# Patient Record
Sex: Male | Born: 1970
Health system: Southern US, Community
[De-identification: ages and names within clinical notes are randomized; demographics above are authoritative.]

## PROBLEM LIST (undated history)

## (undated) DIAGNOSIS — J309 Allergic rhinitis, unspecified: Secondary | ICD-10-CM

## (undated) DIAGNOSIS — R1013 Epigastric pain: Secondary | ICD-10-CM

## (undated) DIAGNOSIS — S9002XA Contusion of left ankle, initial encounter: Secondary | ICD-10-CM

## (undated) DIAGNOSIS — N401 Enlarged prostate with lower urinary tract symptoms: Secondary | ICD-10-CM

## (undated) DIAGNOSIS — G8929 Other chronic pain: Secondary | ICD-10-CM

## (undated) DIAGNOSIS — N138 Other obstructive and reflux uropathy: Secondary | ICD-10-CM

## (undated) DIAGNOSIS — M546 Pain in thoracic spine: Secondary | ICD-10-CM

## (undated) DIAGNOSIS — J4520 Mild intermittent asthma, uncomplicated: Secondary | ICD-10-CM

## (undated) DIAGNOSIS — R208 Other disturbances of skin sensation: Secondary | ICD-10-CM

## (undated) DIAGNOSIS — F419 Anxiety disorder, unspecified: Secondary | ICD-10-CM

## (undated) HISTORY — DX: Benign prostatic hyperplasia with lower urinary tract symptoms: N40.1

## (undated) HISTORY — DX: Other chronic pain: G89.29

## (undated) HISTORY — DX: Allergic rhinitis, unspecified: J30.9

## (undated) HISTORY — DX: Anxiety disorder, unspecified: F41.9

## (undated) HISTORY — DX: Epigastric pain: R10.13

## (undated) HISTORY — DX: Other disturbances of skin sensation: R20.8

## (undated) HISTORY — DX: Other obstructive and reflux uropathy: N13.8

## (undated) HISTORY — DX: Contusion of left ankle, initial encounter: S90.02XA

## (undated) HISTORY — DX: Mild intermittent asthma, uncomplicated: J45.20

## (undated) HISTORY — DX: Pain in thoracic spine: M54.6

---

## 2000-10-09 ENCOUNTER — Encounter: Admission: RE | Admit: 2000-10-09 | Discharge: 2000-10-09 | Payer: Self-pay | Admitting: Family Medicine

## 2000-10-09 ENCOUNTER — Encounter: Payer: Self-pay | Admitting: Family Medicine

## 2000-11-04 ENCOUNTER — Encounter: Admission: RE | Admit: 2000-11-04 | Discharge: 2000-11-04 | Payer: Self-pay | Admitting: Family Medicine

## 2000-11-04 ENCOUNTER — Encounter: Payer: Self-pay | Admitting: Family Medicine

## 2004-07-30 ENCOUNTER — Ambulatory Visit: Payer: Self-pay | Admitting: Internal Medicine

## 2004-08-16 ENCOUNTER — Ambulatory Visit: Payer: Self-pay | Admitting: Internal Medicine

## 2004-10-02 ENCOUNTER — Ambulatory Visit: Payer: Self-pay | Admitting: Internal Medicine

## 2004-10-05 ENCOUNTER — Ambulatory Visit: Payer: Self-pay | Admitting: Internal Medicine

## 2004-12-14 ENCOUNTER — Ambulatory Visit: Payer: Self-pay | Admitting: Internal Medicine

## 2006-07-28 ENCOUNTER — Ambulatory Visit: Payer: Self-pay | Admitting: Internal Medicine

## 2007-07-15 ENCOUNTER — Ambulatory Visit: Payer: Self-pay | Admitting: Internal Medicine

## 2007-08-04 ENCOUNTER — Ambulatory Visit: Payer: Self-pay | Admitting: Internal Medicine

## 2008-02-11 ENCOUNTER — Ambulatory Visit: Payer: Self-pay | Admitting: Internal Medicine

## 2008-02-24 ENCOUNTER — Encounter: Payer: Self-pay | Admitting: Internal Medicine

## 2008-04-15 ENCOUNTER — Ambulatory Visit: Payer: Self-pay | Admitting: Family Medicine

## 2008-04-15 LAB — CONVERTED CEMR LAB: Inflenza A Ag: NEGATIVE

## 2008-06-23 ENCOUNTER — Ambulatory Visit: Payer: Self-pay | Admitting: Internal Medicine

## 2008-07-12 HISTORY — PX: VASECTOMY: SHX75

## 2008-08-11 ENCOUNTER — Ambulatory Visit: Payer: Self-pay | Admitting: Internal Medicine

## 2008-09-23 ENCOUNTER — Encounter: Payer: Self-pay | Admitting: Internal Medicine

## 2008-11-30 ENCOUNTER — Telehealth: Payer: Self-pay | Admitting: Internal Medicine

## 2009-01-12 ENCOUNTER — Ambulatory Visit: Payer: Self-pay | Admitting: Internal Medicine

## 2009-01-12 DIAGNOSIS — R51 Headache: Secondary | ICD-10-CM | POA: Insufficient documentation

## 2009-01-12 DIAGNOSIS — R519 Headache, unspecified: Secondary | ICD-10-CM | POA: Insufficient documentation

## 2009-01-12 DIAGNOSIS — K219 Gastro-esophageal reflux disease without esophagitis: Secondary | ICD-10-CM | POA: Insufficient documentation

## 2009-01-12 DIAGNOSIS — F411 Generalized anxiety disorder: Secondary | ICD-10-CM | POA: Insufficient documentation

## 2009-01-23 ENCOUNTER — Encounter: Payer: Self-pay | Admitting: Internal Medicine

## 2009-10-30 ENCOUNTER — Telehealth (INDEPENDENT_AMBULATORY_CARE_PROVIDER_SITE_OTHER): Payer: Self-pay | Admitting: *Deleted

## 2010-04-11 ENCOUNTER — Encounter: Payer: Self-pay | Admitting: Internal Medicine

## 2010-09-09 LAB — CONVERTED CEMR LAB
ALT: 16 units/L (ref 0–53)
AST: 18 units/L (ref 0–37)
Albumin: 4.3 g/dL (ref 3.5–5.2)
Alkaline Phosphatase: 69 units/L (ref 39–117)
BUN: 15 mg/dL (ref 6–23)
Basophils Absolute: 0 10*3/uL (ref 0.0–0.1)
Basophils Relative: 0 % (ref 0.0–1.0)
Bilirubin, Direct: 0.1 mg/dL (ref 0.0–0.3)
CO2: 32 meq/L (ref 19–32)
Calcium: 9.4 mg/dL (ref 8.4–10.5)
Chloride: 100 meq/L (ref 96–112)
Cholesterol: 124 mg/dL (ref 0–200)
Creatinine, Ser: 0.8 mg/dL (ref 0.4–1.5)
Eosinophils Absolute: 0.1 10*3/uL (ref 0.0–0.6)
Eosinophils Relative: 1.7 % (ref 0.0–5.0)
GFR calc Af Amer: 142 mL/min
GFR calc non Af Amer: 118 mL/min
Glucose, Bld: 90 mg/dL (ref 70–99)
HCT: 40.9 % (ref 39.0–52.0)
HDL: 38.4 mg/dL — ABNORMAL LOW (ref >39.0)
Hemoglobin: 13.9 g/dL (ref 13.0–17.0)
LDL Cholesterol: 78 mg/dL (ref 0–99)
Lymphocytes Relative: 24 % (ref 12.0–46.0)
MCHC: 34 g/dL (ref 30.0–36.0)
MCV: 86.8 fL (ref 78.0–100.0)
Monocytes Absolute: 0.5 10*3/uL (ref 0.2–0.7)
Monocytes Relative: 11.2 % — ABNORMAL HIGH (ref 3.0–11.0)
Neutro Abs: 2.7 10*3/uL (ref 1.4–7.7)
Neutrophils Relative %: 63.1 % (ref 43.0–77.0)
Platelets: 196 10*3/uL (ref 150–400)
Potassium: 4.1 meq/L (ref 3.5–5.1)
RBC: 4.71 M/uL (ref 4.22–5.81)
RDW: 12.5 % (ref 11.5–14.6)
Sodium: 138 meq/L (ref 135–145)
TSH: 0.96 microintl units/mL (ref 0.35–5.50)
Total Bilirubin: 0.5 mg/dL (ref 0.3–1.2)
Total CHOL/HDL Ratio: 3.2
Total Protein: 6.6 g/dL (ref 6.0–8.3)
Triglycerides: 37 mg/dL (ref 0–149)
VLDL: 7 mg/dL (ref 0–40)
WBC: 4.3 10*3/uL — ABNORMAL LOW (ref 4.5–10.5)

## 2010-09-11 NOTE — Progress Notes (Signed)
Summary: Refil Request  Phone Note Refill Request Message from:  [facility] [address] Fax #: [phone]  Refills Requested: Medication #1:  CLONAZEPAM 0.5 MG  TABS Take 1/2 to 1 tablet every 8 to 12 hours as needed   Dosage confirmed as above?Dosage Confirmed   Last Refilled: 02/08/2009 Next Appointment Scheduled: none Initial call taken by: Harold Barban,  October 30, 2009 9:55 AM    Prescriptions: CLONAZEPAM 0.5 MG  TABS (CLONAZEPAM) Take 1/2 to 1 tablet every 8 to 12 hours as needed  #60 x 1   Entered by:   Shonna Chock   Authorized by:   Marga Melnick MD   Signed by:   Shonna Chock on 10/30/2009   Method used:   Printed then faxed to ...         RxID:   [id number]

## 2010-09-11 NOTE — Letter (Signed)
[facility]   Imported By: Lanelle Bal 04/24/2010 09:18:30  _____________________________________________________________________  External Attachment:    Type:   Image     Comment:   External Document

## 2010-09-14 ENCOUNTER — Encounter: Payer: Self-pay | Admitting: Internal Medicine

## 2010-09-14 ENCOUNTER — Ambulatory Visit (INDEPENDENT_AMBULATORY_CARE_PROVIDER_SITE_OTHER): Payer: BC Managed Care – PPO | Admitting: Internal Medicine

## 2010-09-14 DIAGNOSIS — J069 Acute upper respiratory infection, unspecified: Secondary | ICD-10-CM | POA: Insufficient documentation

## 2010-09-19 ENCOUNTER — Ambulatory Visit: Payer: Self-pay | Admitting: Internal Medicine

## 2010-09-27 NOTE — Assessment & Plan Note (Signed)
Summary: fever/kn   Vital Signs:  Patient profile:   40 year old male Height:      68.25 inches Weight:      151.50 pounds BMI:     22.95 Temp:     98.4 degrees F oral Pulse rate:   80 / minute Pulse rhythm:   regular BP sitting:   126 / 82  (left arm) Cuff size:   regular  Vitals Entered By: Army Fossa CMA (September 14, 2010 11:29 AM) CC: Pt here states his temp has been 96-97 Comments [facility]    History of Present Illness: he thinks his temperature has been low. He checked his temperature with a thermometer in the forehead and it read 96. He feels fine except for a cold that started few days ago. See review of systems  Current Medications (verified): 1)  Clonazepam 0.5 Mg  Tabs (Clonazepam) .... Take 1/2 To 1 Tablet Every 8 To 12 Hours As Needed 2)  Sudafed 24 Hour Non-Drowsy 240 Mg Xr24h-Tab (Pseudoephedrine Hcl) .... As Needed. 3)  [facility] Ibuprofen 200 Mg Tabs (Ibuprofen) .... As Needed. 4)  Vicks Nyquil Multi-Symptom 15-6.25-325 Mg Caps (Dm-Doxylamine-Acetaminophen) .... As Needed For Sleep 5)  Claritin 10 Mg Tabs (Loratadine) .... Qd 6)  Effexor Xr 37.5 Mg Xr24h-Cap (Venlafaxine Hcl) .... Qd  Allergies (verified): 1)  ! * Z Pack  Past History:  Past Medical History: Reviewed history from 01/12/2009 and no changes required. Anxiety ,treated with  Clonazepam as needed  GERD  Past Surgical History: Reviewed history from 01/12/2009 and no changes required. Vasectomy  Social History: Reviewed history from 01/12/2009 and no changes required. No diet Married Never Smoked Alcohol use-yes: occa Regular exercise-yes Occupation: [employer]  Review of Systems ENT:  Denies ear discharge and sore throat; (+) congestion @ L sinus for the last few days occasional throbbing pain around the left ear . Resp:  Denies cough. GI:  Denies diarrhea, nausea, and vomiting. GU:  Denies dysuria, hematuria, urinary frequency, and urinary hesitancy.  Physical  Exam  General:  alert and well-developed.  no apparent distress Head:  face symmetric, nontender to palpation Ears:  R ear normal and L ear normal.  not tender to percussion at the mastoid area Nose:  slightly congested Mouth:  no redness or discharge Lungs:  Normal respiratory effort, chest expands symmetrically. Lungs are clear to auscultation, no crackles or wheezes.    Impression & Recommendations:  Problem # 1:  low temperature? temperature today is a little high, observation   Problem # 2:  URI (ICD-465.9) developed URI symptoms for few days including a throbbing pain in the mastoid area on the left. Exam is benign. Use Flonase for a month, see instructions    Complete Medication List: 1)  Clonazepam 0.5 Mg Tabs (Clonazepam) .... Take 1/2 to 1 tablet every 8 to 12 hours as needed 2)  Sudafed 24 Hour Non-drowsy 240 Mg Xr24h-tab (Pseudoephedrine hcl) .... As needed. 3)  [facility] Ibuprofen 200 Mg Tabs (Ibuprofen) .... As needed. 4)  Vicks Nyquil Multi-symptom 15-6.25-325 Mg Caps (Dm-doxylamine-acetaminophen) .... As needed for sleep 5)  Claritin 10 Mg Tabs (Loratadine) .... Qd 6)  Effexor Xr 37.5 Mg Xr24h-cap (Venlafaxine hcl) .... Qd 7)  Flonase 50 Mcg/act Susp (Fluticasone propionate) .... 2 sprays on each side of the nose x 1 month  Patient Instructions: 1)  rest, fluids 2)  flonase x 1 month 3)  call if symptoms get worse , high fever Prescriptions: FLONASE 50  MCG/ACT SUSP (FLUTICASONE PROPIONATE) 2 sprays on each side of the nose x 1 month  #1 x 1   Entered and Authorized by:   Elita Quick E. Jayden Kratochvil MD   Signed by:   Nolon Rod. Amahri Dengel MD on 09/14/2010   Method used:   Print then Give to Patient   RxID:   [id number]    Orders Added: 1)  Est. Patient Level III [13086]

## 2010-12-26 ENCOUNTER — Other Ambulatory Visit: Payer: Self-pay | Admitting: Internal Medicine

## 2011-01-30 ENCOUNTER — Other Ambulatory Visit: Payer: Self-pay | Admitting: Internal Medicine

## 2011-02-05 ENCOUNTER — Other Ambulatory Visit: Payer: Self-pay | Admitting: Internal Medicine

## 2011-02-06 MED ORDER — CLONAZEPAM 0.5 MG PO TABS: 0.5000 mg | ORAL_TABLET | Freq: Two times a day (BID) | ORAL | Status: DC

## 2011-02-06 NOTE — Telephone Encounter (Signed)
Medication wasn't sent to pharmacy pt last seen by hop 2010 will need office visit

## 2011-02-11 ENCOUNTER — Telehealth: Payer: Self-pay

## 2011-02-11 NOTE — Telephone Encounter (Signed)
Paper fax received from [facility] phone [phone], fax [phone], Clonazepam 0.5mg  Tab  (1/2-1 by mouth every 8-12 hours as needed).   **Last OV with Dr.Hopper 01/2009. Seen for acute by Dr.Paz 09/2010 (NO PENDING APPOINTMENT)** I called the pharmacy and informed them Dr.Hopper out of office until Thursday afternoon  Dr.Hopper please advise

## 2011-02-12 MED ORDER — CLONAZEPAM 0.5 MG PO TABS: 0.5000 mg | ORAL_TABLET | Freq: Two times a day (BID) | ORAL | Status: DC

## 2011-02-12 NOTE — Telephone Encounter (Signed)
OK #30 

## 2011-02-12 NOTE — Telephone Encounter (Signed)
RX called in .

## 2011-03-09 ENCOUNTER — Other Ambulatory Visit: Payer: Self-pay | Admitting: Internal Medicine

## 2011-06-06 ENCOUNTER — Other Ambulatory Visit: Payer: Self-pay | Admitting: Internal Medicine

## 2011-06-06 NOTE — Telephone Encounter (Signed)
Last 09/14/10

## 2011-06-12 NOTE — Telephone Encounter (Signed)
Denied, last office visit with PCP, Dr. Alwyn Ren in 2010.

## 2011-06-13 ENCOUNTER — Encounter: Payer: Self-pay | Admitting: Internal Medicine

## 2011-06-14 ENCOUNTER — Ambulatory Visit (INDEPENDENT_AMBULATORY_CARE_PROVIDER_SITE_OTHER): Payer: BC Managed Care – PPO | Admitting: Internal Medicine

## 2011-06-14 ENCOUNTER — Encounter: Payer: Self-pay | Admitting: Internal Medicine

## 2011-06-14 ENCOUNTER — Ambulatory Visit (HOSPITAL_BASED_OUTPATIENT_CLINIC_OR_DEPARTMENT_OTHER)
Admission: RE | Admit: 2011-06-14 | Discharge: 2011-06-14 | Disposition: A | Discharge status: Home / Self Care | Payer: BC Managed Care – PPO | Source: Ambulatory Visit | Attending: Internal Medicine | Admitting: Internal Medicine

## 2011-06-14 DIAGNOSIS — R059 Cough, unspecified: Secondary | ICD-10-CM | POA: Insufficient documentation

## 2011-06-14 DIAGNOSIS — R05 Cough: Secondary | ICD-10-CM

## 2011-06-14 DIAGNOSIS — J209 Acute bronchitis, unspecified: Secondary | ICD-10-CM

## 2011-06-14 DIAGNOSIS — J309 Allergic rhinitis, unspecified: Secondary | ICD-10-CM

## 2011-06-14 LAB — CBC WITH DIFFERENTIAL/PLATELET
Basophils Absolute: 0 10*3/uL (ref 0.0–0.1)
Basophils Relative: 0.5 % (ref 0.0–3.0)
Eosinophils Absolute: 0.1 10*3/uL (ref 0.0–0.7)
HCT: 42.2 % (ref 39.0–52.0)
Hemoglobin: 13.9 g/dL (ref 13.0–17.0)
Lymphocytes Relative: 12.2 % (ref 12.0–46.0)
Lymphs Abs: 1.3 10*3/uL (ref 0.7–4.0)
MCHC: 33 g/dL (ref 30.0–36.0)
MCV: 87.5 fl (ref 78.0–100.0)
Monocytes Absolute: 0.7 10*3/uL (ref 0.1–1.0)
Neutro Abs: 8.4 10*3/uL — ABNORMAL HIGH (ref 1.4–7.7)
RBC: 4.83 Mil/uL (ref 4.22–5.81)
RDW: 12.3 % (ref 11.5–14.6)

## 2011-06-14 MED ORDER — FLUTICASONE-SALMETEROL 250-50 MCG/DOSE IN AEPB: 1.0000 | INHALATION_SPRAY | Freq: Two times a day (BID) | RESPIRATORY_TRACT | Status: DC

## 2011-06-14 MED ORDER — FLUTICASONE PROPIONATE 50 MCG/ACT NA SUSP: 1.0000 | NASAL | Status: DC

## 2011-06-14 MED ORDER — HYDROCODONE-HOMATROPINE 5-1.5 MG/5ML PO SYRP: 5.0000 mL | ORAL_SOLUTION | Freq: Four times a day (QID) | ORAL | Status: AC | PRN

## 2011-06-14 MED ORDER — PREDNISONE 20 MG PO TABS: 20.0000 mg | ORAL_TABLET | Freq: Two times a day (BID) | ORAL | Status: AC

## 2011-06-14 NOTE — Patient Instructions (Addendum)
Plain Mucinex for thick secretions ;force NON dairy fluids for next 48 hrs. Use a Neti pot daily as needed for sinus congestion  Order for x-rays entered into  the computer; these will be performed at [facility], [address]. No appointment is necessary.

## 2011-06-14 NOTE — Progress Notes (Signed)
  Subjective:    Patient ID: Travis Reeves, male    DOB: 1971-06-01, 40 y.o.   MRN: [id number]  HPI Cough: Onset/symptoms: Rapid Strep documented ST 3 weeks ago; treated with PCN X 10 days [facility];nasal congestion after 3 days of Pen VK Progression of symptoms:to chest after head symptoms; [facility] 10[facility] ; Cefdinir Rxed with marginal benefit Present symptoms: Fever/chills/sweats:no Frontal headache:no Facial pain:no Nasal purulence:no color Sore throat:not now Dental pain:no Lymphadenopathy:no Wheezing/shortness of breath:no but [facility]. Rxed albuterol Cough/sputum/hemoptysis:scant clear sputum Pleuritic pain:no Associated extrinsic/allergic symptoms:itchy eyes/ sneezing:no Past medical history: Seasonal allergies : yes/asthma:no; MGM  & mother had asthma Smoking history:no            Review of Systems     Objective:   Physical Exam General appearance:thin but  good health and nourishment; no acute distress or increased work of breathing is present.  No  lymphadenopathy about the head, neck, or axilla noted.   Eyes: No conjunctival inflammation or lid edema is present. There is no scleral icterus.  Ears:  External ear exam shows no significant lesions or deformities.  Otoscopic examination reveals clear canals, tympanic membranes are intact bilaterally without bulging, retraction, inflammation or discharge.  Nose:  External nasal examination shows no deformity or inflammation. Nasal mucosa are pink and moist without lesions or exudates. No septal dislocation.No obstruction to airflow.   Oral exam: Dental hygiene is good; lips and gums are healthy appearing.There is no oropharyngeal erythema or exudate noted.     Heart:  Normal rate and regular rhythm. S1 and S2 normal without gallop, murmur, click, rub or other extra sounds.   Lungs:Chest clear to auscultation; no wheezes, rhonchi,rales ,or rubs present.No increased work of breathing. Although  there is no clinical bronchospasm on auscultation; with forced expiration there is harsh wheezing of upper airway  Extremities:  No cyanosis, edema, or clubbing  noted    Skin: Warm & dry w/o jaundice or tenting.         Assessment & Plan:  #1 protracted cough; clinically reactive airways disease is suggested. At this time he has no purulent secretions after a course of penicillin and partial course of Cefdinir.  Plan: See orders recommendations.

## 2011-06-19 ENCOUNTER — Telehealth: Payer: Self-pay

## 2011-06-19 NOTE — Telephone Encounter (Signed)
Pt called for CXR results. Advised pt that Hopp noted no pneumonia present. Pt verbalized understanding

## 2011-07-11 ENCOUNTER — Ambulatory Visit: Payer: BC Managed Care – PPO | Admitting: Internal Medicine

## 2012-06-27 ENCOUNTER — Other Ambulatory Visit: Payer: Self-pay | Admitting: Internal Medicine

## 2012-08-18 ENCOUNTER — Other Ambulatory Visit: Payer: Self-pay | Admitting: Internal Medicine

## 2012-08-18 NOTE — Telephone Encounter (Signed)
Patient needs to schedule a CPX  

## 2012-10-08 ENCOUNTER — Other Ambulatory Visit: Payer: Self-pay | Admitting: Internal Medicine

## 2012-10-08 NOTE — Telephone Encounter (Signed)
Patient needs to schedule CPX, last OV 2012

## 2012-11-27 ENCOUNTER — Other Ambulatory Visit: Payer: Self-pay | Admitting: Internal Medicine

## 2012-11-27 NOTE — Telephone Encounter (Signed)
Pt has not been seen in over a year. OK to refill? 

## 2012-11-27 NOTE — Telephone Encounter (Signed)
Refill done.  

## 2012-11-27 NOTE — Telephone Encounter (Signed)
OK ; R X 11

## 2013-03-05 ENCOUNTER — Encounter: Payer: Self-pay | Admitting: Internal Medicine

## 2013-03-05 ENCOUNTER — Ambulatory Visit: Payer: BC Managed Care – PPO

## 2013-03-05 ENCOUNTER — Ambulatory Visit (INDEPENDENT_AMBULATORY_CARE_PROVIDER_SITE_OTHER): Payer: BC Managed Care – PPO | Admitting: Internal Medicine

## 2013-03-05 VITALS — BP 106/62 | HR 64 | Temp 98.0°F | Resp 12 | Ht 69.5 in | Wt 146.0 lb

## 2013-03-05 DIAGNOSIS — Z0000 Encounter for general adult medical examination without abnormal findings: Secondary | ICD-10-CM

## 2013-03-05 DIAGNOSIS — J310 Chronic rhinitis: Secondary | ICD-10-CM

## 2013-03-05 DIAGNOSIS — H109 Unspecified conjunctivitis: Secondary | ICD-10-CM | POA: Insufficient documentation

## 2013-03-05 DIAGNOSIS — Z23 Encounter for immunization: Secondary | ICD-10-CM

## 2013-03-05 LAB — CBC WITH DIFFERENTIAL/PLATELET
Basophils Absolute: 0 10*3/uL (ref 0.0–0.1)
Basophils Relative: 1 % (ref 0–1)
Hemoglobin: 13.8 g/dL (ref 13.0–17.0)
Lymphocytes Relative: 28 % (ref 12–46)
MCHC: 33.3 g/dL (ref 30.0–36.0)
Neutro Abs: 2.5 10*3/uL (ref 1.7–7.7)
Neutrophils Relative %: 58 % (ref 43–77)
RDW: 13.9 % (ref 11.5–15.5)
WBC: 4.3 10*3/uL (ref 4.0–10.5)

## 2013-03-05 LAB — BASIC METABOLIC PANEL
BUN: 16 mg/dL (ref 6–23)
Chloride: 104 mEq/L (ref 96–112)
Glucose, Bld: 83 mg/dL (ref 70–99)
Potassium: 4.3 mEq/L (ref 3.5–5.1)

## 2013-03-05 LAB — LIPID PANEL: Cholesterol: 150 mg/dL (ref 0–200)

## 2013-03-05 LAB — HEPATIC FUNCTION PANEL
ALT: 16 U/L (ref 0–53)
AST: 18 U/L (ref 0–37)
Albumin: 4.2 g/dL (ref 3.5–5.2)

## 2013-03-05 NOTE — Progress Notes (Signed)
  Subjective:    Patient ID: Travis Reeves, male    DOB: June 28, 1971, 42 y.o.   MRN: [id number]  HPI  He is here for a physical;acute issues denied.     Review of Systems   He has had perennial rhinoconjunctivitis since childhood. Previously had sneezing but this is controlled with Claritin and Flonase. His major symptoms consist of itchy, watery eyes. Symptoms do tend to exacerbate seasonally.     Objective:   Physical Exam  Gen.: Healthy and well-nourished in appearance. Alert, appropriate and cooperative throughout exam. Appears younger than stated age  Head: Normocephalic without obvious abnormalities; no alopecia  Eyes: No corneal or conjunctival inflammation noted. Pupils equal round reactive to light and accommodation.  Extraocular motion intact. Vision grossly normal with lenses Ears: External  ear exam reveals no significant lesions or deformities. Canals clear .TMs normal. Hearing is grossly normal bilaterally. Nose: External nasal exam reveals no deformity or inflammation. Nasal mucosa are pink and moist. No lesions or exudates noted.  Mouth: Oral mucosa and oropharynx reveal no lesions or exudates. Teeth in good repair. Neck: No deformities, masses, or tenderness noted. Range of motion & Thyroid normal. Lungs: Normal respiratory effort; chest expands symmetrically. Lungs are clear to auscultation without rales, wheezes, or increased work of breathing. Heart: Normal rate and rhythm. Normal S1 and S2. No gallop or rub. Intermittent apical click w/o MR murmur. Abdomen: Bowel sounds normal; abdomen soft and nontender. No masses, organomegaly or hernias noted. Genitalia: Genitalia normal except for bilateral post vasectomy granuloma. Prostate is normal without enlargement, asymmetry, nodularity, or induration.                                  Musculoskeletal/extremities: No deformity or scoliosis noted of  the thoracic or lumbar spine.  No clubbing, cyanosis, edema, or  significant extremity  deformity noted. Range of motion normal .Tone & strength  Normal. Joints normal . Nail health good. Able to lie down & sit up w/o help. Negative SLR bilaterally Vascular: Carotid, radial artery, dorsalis pedis and  posterior tibial pulses are full and equal. No bruits present. Neurologic: Alert and oriented x3. Deep tendon reflexes symmetrical and normal.        Skin: Intact without suspicious lesions or rashes.4 X 8 mm nevus anterior neck Lymph: No cervical, axillary, or inguinal lymphadenopathy present. Psych: Mood and affect are normal. Normally interactive                                                                                      Assessment & Plan:  #1 comprehensive physical exam; no acute findings  Plan: see Orders  & Recommendations

## 2013-03-05 NOTE — Patient Instructions (Addendum)
Plain Mucinex (NOT D) for thick secretions ;force NON dairy fluids .   Nasal cleansing in the shower as discussed with lather of mild shampoo.After 10 seconds wash off lather while  exhaling through nostrils. Make sure that all residual soap is removed to prevent irritation.  Fluticasone 1 spray in each nostril twice a day as needed. Use the "crossover" technique into opposite nostril spraying toward opposite ear @ 45 degree angle, not straight up into nostril.  Use a Neti pot daily only  as needed for significant sinus congestion; going from open side to congested side . Plain Allegra (NOT D )  160 daily , Loratidine 10 mg , OR Zyrtec 10 mg @ bedtime  as needed for itchy eyes & sneezing.  Use your cell phone camera to monitor  the skin lesions (nevi). Take a photo of the skin lesions every 3 months with a small ruler immediately below the lesion to define any change in size, shape or color.      If you activate the  My Chart system; lab & Xray results will be released directly  to you as soon as I review & address these through the computer. If you choose not to sign up for My Chart within 36 hours of labs being drawn; results will be reviewed & interpretation added before being copied & mailed, causing a delay in getting the results to you.If you do not receive that report within 7-10 days ,please call. Additionally you can use this system to gain direct  access to your records  if  out of town or @ an office of a  physician who is not in  the My Chart network.  This improves continuity of care & places you in control of your medical record.

## 2013-11-30 ENCOUNTER — Other Ambulatory Visit: Payer: Self-pay

## 2013-11-30 MED ORDER — FLUTICASONE PROPIONATE 50 MCG/ACT NA SUSP
NASAL | Status: DC
Start: 2013-11-30 — End: 2014-12-29

## 2014-06-23 ENCOUNTER — Other Ambulatory Visit (INDEPENDENT_AMBULATORY_CARE_PROVIDER_SITE_OTHER): Payer: BC Managed Care – PPO

## 2014-06-23 ENCOUNTER — Encounter: Payer: Self-pay | Admitting: Internal Medicine

## 2014-06-23 ENCOUNTER — Ambulatory Visit (INDEPENDENT_AMBULATORY_CARE_PROVIDER_SITE_OTHER): Payer: BC Managed Care – PPO | Admitting: Internal Medicine

## 2014-06-23 VITALS — BP 120/78 | HR 60 | Temp 97.7°F | Resp 12 | Ht 69.0 in | Wt 149.0 lb

## 2014-06-23 DIAGNOSIS — Z0189 Encounter for other specified special examinations: Secondary | ICD-10-CM

## 2014-06-23 DIAGNOSIS — Z0000 Encounter for general adult medical examination without abnormal findings: Secondary | ICD-10-CM

## 2014-06-23 LAB — CBC WITH DIFFERENTIAL/PLATELET
BASOS ABS: 0 10*3/uL (ref 0.0–0.1)
Basophils Relative: 0.4 % (ref 0.0–3.0)
EOS ABS: 0.2 10*3/uL (ref 0.0–0.7)
Eosinophils Relative: 2.5 % (ref 0.0–5.0)
HCT: 43.1 % (ref 39.0–52.0)
Hemoglobin: 14.3 g/dL (ref 13.0–17.0)
LYMPHS PCT: 16.7 % (ref 12.0–46.0)
Lymphs Abs: 1.2 10*3/uL (ref 0.7–4.0)
MCHC: 33.2 g/dL (ref 30.0–36.0)
MCV: 85.8 fl (ref 78.0–100.0)
MONO ABS: 0.6 10*3/uL (ref 0.1–1.0)
Monocytes Relative: 7.9 % (ref 3.0–12.0)
NEUTROS PCT: 72.5 % (ref 43.0–77.0)
Neutro Abs: 5 10*3/uL (ref 1.4–7.7)
PLATELETS: 201 10*3/uL (ref 150.0–400.0)
RBC: 5.02 Mil/uL (ref 4.22–5.81)
RDW: 12.8 % (ref 11.5–15.5)
WBC: 7 10*3/uL (ref 4.0–10.5)

## 2014-06-23 LAB — HEPATIC FUNCTION PANEL
ALBUMIN: 3.9 g/dL (ref 3.5–5.2)
ALK PHOS: 61 U/L (ref 39–117)
ALT: 17 U/L (ref 0–53)
AST: 20 U/L (ref 0–37)
BILIRUBIN DIRECT: 0.1 mg/dL (ref 0.0–0.3)
Total Bilirubin: 0.7 mg/dL (ref 0.2–1.2)
Total Protein: 6.7 g/dL (ref 6.0–8.3)

## 2014-06-23 LAB — LIPID PANEL
CHOL/HDL RATIO: 4
Cholesterol: 170 mg/dL (ref 0–200)
HDL: 45.7 mg/dL (ref >39.00)
LDL Cholesterol: 112 mg/dL — ABNORMAL HIGH (ref 0–99)
NONHDL: 124.3
TRIGLYCERIDES: 61 mg/dL (ref 0.0–149.0)
VLDL: 12.2 mg/dL (ref 0.0–40.0)

## 2014-06-23 LAB — BASIC METABOLIC PANEL
BUN: 16 mg/dL (ref 6–23)
CALCIUM: 9.2 mg/dL (ref 8.4–10.5)
CO2: 28 meq/L (ref 19–32)
CREATININE: 0.8 mg/dL (ref 0.4–1.5)
Chloride: 104 mEq/L (ref 96–112)
GFR: 121.5 mL/min (ref >60.00)
GLUCOSE: 92 mg/dL (ref 70–99)
Potassium: 4 mEq/L (ref 3.5–5.1)
Sodium: 139 mEq/L (ref 135–145)

## 2014-06-23 LAB — TSH: TSH: 0.99 u[IU]/mL (ref 0.35–4.50)

## 2014-06-23 NOTE — Progress Notes (Signed)
   Subjective:    Patient ID: Travis Reeves, male    DOB: 07-Apr-1971, 43 y.o.   MRN: [id number]  HPI He is here for a physical;acute issues denied other than resolving URI symptoms of head congestion.  He is on heart healthy diet for the most part. Typically he will run over 6 miles twice a week with no cardiopulmonary symptoms.  Both parents had diabetes as well as dyslipidemia.  She has seasonal allergies which are mainly conjunctivitis in nature. He does not feel that these are significant.     Review of Systems   Chest pain, palpitations, tachycardia, exertional dyspnea, paroxysmal nocturnal dyspnea, claudication or edema are absent.  Frontal headache, facial pain , nasal purulence, dental pain, sore throat , otic pain or otic discharge denied. No fever , chills or sweats.      Objective:   Physical Exam  Gen.: Healthy and well-nourished in appearance. Alert, appropriate and cooperative throughout exam. Appears younger than stated age  Head: Normocephalic without obvious abnormalities; no alopecia  Eyes: No corneal or conjunctival inflammation noted. Minor scleritis present.Pupils equal round reactive to light and accommodation. Extraocular motion intact.  Ears: External  ear exam reveals no significant lesions or deformities. Canals clear .TMs normal. Hearing is grossly normal bilaterally. Nose: External nasal exam reveals no deformity or inflammation. Nasal mucosa are pink and moist. No lesions or exudates noted.   Mouth: Oral mucosa and oropharynx reveal no lesions or exudates. Teeth in good repair. Neck: No deformities, masses, or tenderness noted. Range of motion & Thyroid normal. Lungs: Normal respiratory effort; chest expands symmetrically. Lungs are clear to auscultation without rales, wheezes, or increased work of breathing. Heart: Normal rate and rhythm. Normal S1 and S2. No gallop, click, or rub. No murmur. Abdomen: Bowel sounds normal; abdomen soft and  nontender. No masses, organomegaly or hernias noted. Genitalia: Genitalia normal except for vasectomy scar tissue. Weakness w/o hernia R intrascrotal area. Prostate is  upper limits normal in size without  asymmetry, nodularity, or induration       Musculoskeletal/extremities: No deformity or scoliosis noted of  the thoracic or lumbar spine.  No clubbing, cyanosis, edema, or significant extremity  deformity noted. Range of motion normal .Tone & strength normal. Hand joints normal  Fingernail health good. Able to lie down & sit up w/o help. Negative SLR bilaterally Vascular: Carotid, radial artery, dorsalis pedis and  posterior tibial pulses are full and equal. No bruits present. Neurologic: Alert and oriented x3. Deep tendon reflexes symmetrical and normal.  Gait normal.      Skin: Intact without suspicious lesions or rashes. Lymph: No cervical, axillary, or inguinal lymphadenopathy present. Psych: Mood and affect are normal. Normally interactive                                                                                       Assessment & Plan:  #1 comprehensive physical exam; no acute findings  Plan: see Orders  & Recommendations

## 2014-06-23 NOTE — Patient Instructions (Addendum)
Your next office appointment will be determined based upon review of your pending labs . Those instructions will be transmitted to you through My Chart  OR  by mail;whichever process is your choice to receive results & recommendations .  As per the Standard of Care , screening Colonoscopy recommended @ 50 & every 5-10 years thereafter . More frequent monitor would be dictated by family history ( ADENOMATOUS polyps or colon cancer) or findings @ Colonoscopy  Plain Mucinex (NOT D) for thick secretions ;force NON dairy fluids .   Nasal cleansing in the shower as discussed with lather of mild shampoo.After 10 seconds wash off lather while  exhaling through nostrils. Make sure that all residual soap is removed to prevent irritation.  Flonase OR Nasacort AQ 1 spray in each nostril twice a day as needed. Use the "crossover" technique into opposite nostril spraying toward opposite ear @ 45 degree angle, not straight up into nostril.  Use a Neti pot daily only  as needed for significant sinus congestion; going from open side to congested side . Plain Allegra (NOT D )  160 daily , Loratidine 10 mg , OR Zyrtec 10 mg @ bedtime  as needed for itchy eyes & sneezing.

## 2014-06-23 NOTE — Progress Notes (Signed)
Pre visit review using our clinic review tool, if applicable. No additional management support is needed unless otherwise documented below in the visit note. 

## 2014-06-24 ENCOUNTER — Encounter: Payer: Self-pay | Admitting: Internal Medicine

## 2014-06-24 DIAGNOSIS — E785 Hyperlipidemia, unspecified: Secondary | ICD-10-CM | POA: Insufficient documentation

## 2014-12-29 ENCOUNTER — Other Ambulatory Visit: Payer: Self-pay | Admitting: Internal Medicine

## 2015-11-30 ENCOUNTER — Other Ambulatory Visit (INDEPENDENT_AMBULATORY_CARE_PROVIDER_SITE_OTHER): Payer: BLUE CROSS/BLUE SHIELD

## 2015-11-30 ENCOUNTER — Encounter: Payer: Self-pay | Admitting: Family

## 2015-11-30 ENCOUNTER — Ambulatory Visit (INDEPENDENT_AMBULATORY_CARE_PROVIDER_SITE_OTHER): Payer: BLUE CROSS/BLUE SHIELD | Admitting: Family

## 2015-11-30 ENCOUNTER — Telehealth: Payer: Self-pay | Admitting: Family

## 2015-11-30 VITALS — BP 108/80 | HR 54 | Temp 97.9°F | Resp 16 | Ht 69.5 in | Wt 146.0 lb

## 2015-11-30 DIAGNOSIS — Z0000 Encounter for general adult medical examination without abnormal findings: Secondary | ICD-10-CM | POA: Diagnosis not present

## 2015-11-30 LAB — CBC
HCT: 42.3 % (ref 39.0–52.0)
Hemoglobin: 14.3 g/dL (ref 13.0–17.0)
MCHC: 33.9 g/dL (ref 30.0–36.0)
MCV: 85 fl (ref 78.0–100.0)
Platelets: 242 10*3/uL (ref 150.0–400.0)
RBC: 4.98 Mil/uL (ref 4.22–5.81)
RDW: 12.6 % (ref 11.5–15.5)
WBC: 5.2 10*3/uL (ref 4.0–10.5)

## 2015-11-30 LAB — COMPREHENSIVE METABOLIC PANEL
ALBUMIN: 4.5 g/dL (ref 3.5–5.2)
ALK PHOS: 63 U/L (ref 39–117)
ALT: 16 U/L (ref 0–53)
AST: 19 U/L (ref 0–37)
BUN: 15 mg/dL (ref 6–23)
CHLORIDE: 104 meq/L (ref 96–112)
CO2: 29 mEq/L (ref 19–32)
Calcium: 9.4 mg/dL (ref 8.4–10.5)
Creatinine, Ser: 0.96 mg/dL (ref 0.40–1.50)
GFR: 90.75 mL/min (ref >60.00)
Glucose, Bld: 82 mg/dL (ref 70–99)
Potassium: 4.7 mEq/L (ref 3.5–5.1)
SODIUM: 139 meq/L (ref 135–145)
TOTAL PROTEIN: 6.6 g/dL (ref 6.0–8.3)
Total Bilirubin: 0.9 mg/dL (ref 0.2–1.2)

## 2015-11-30 LAB — LIPID PANEL
Cholesterol: 153 mg/dL (ref 0–200)
HDL: 52.3 mg/dL (ref >39.00)
LDL CALC: 95 mg/dL (ref 0–99)
NonHDL: 100.75
Total CHOL/HDL Ratio: 3
Triglycerides: 28 mg/dL (ref 0.0–149.0)
VLDL: 5.6 mg/dL (ref 0.0–40.0)

## 2015-11-30 LAB — TSH: TSH: 1.07 u[IU]/mL (ref 0.35–4.50)

## 2015-11-30 NOTE — Progress Notes (Signed)
Pre visit review using our clinic review tool, if applicable. No additional management support is needed unless otherwise documented below in the visit note. 

## 2015-11-30 NOTE — Patient Instructions (Signed)
Thank you for choosing [facility].  Summary/Instructions:  Please stop by the lab on the basement level of the building for your blood work. Your results will be released to MyChart (or called to you) after review, usually within 72 hours after test completion. If any changes need to be made, you will be notified at that same time.  Health Maintenance, Male A healthy lifestyle and preventative care can promote health and wellness.  Maintain regular health, dental, and eye exams.  Eat a healthy diet. Foods like vegetables, fruits, whole grains, low-fat dairy products, and lean protein foods contain the nutrients you need and are low in calories. Decrease your intake of foods high in solid fats, added sugars, and salt. Get information about a proper diet from your health care provider, if necessary.  Regular physical exercise is one of the most important things you can do for your health. Most adults should get at least 150 minutes of moderate-intensity exercise (any activity that increases your heart rate and causes you to sweat) each week. In addition, most adults need muscle-strengthening exercises on 2 or more days a week.   Maintain a healthy weight. The body mass index (BMI) is a screening tool to identify possible weight problems. It provides an estimate of body fat based on height and weight. Your health care provider can find your BMI and can help you achieve or maintain a healthy weight. For males 20 years and older:  A BMI below 18.5 is considered underweight.  A BMI of 18.5 to 24.9 is normal.  A BMI of 25 to 29.9 is considered overweight.  A BMI of 30 and above is considered obese.  Maintain normal blood lipids and cholesterol by exercising and minimizing your intake of saturated fat. Eat a balanced diet with plenty of fruits and vegetables. Blood tests for lipids and cholesterol should begin at age 20 and be repeated every 5 years. If your lipid or cholesterol levels are  high, you are over age 50, or you are at high risk for heart disease, you may need your cholesterol levels checked more frequently.Ongoing high lipid and cholesterol levels should be treated with medicines if diet and exercise are not working.  If you smoke, find out from your health care provider how to quit. If you do not use tobacco, do not start.  Lung cancer screening is recommended for adults aged 55-80 years who are at high risk for developing lung cancer because of a history of smoking. A yearly low-dose CT scan of the lungs is recommended for people who have at least a 30-pack-year history of smoking and are current smokers or have quit within the past 15 years. A pack year of smoking is smoking an average of 1 pack of cigarettes a day for 1 year (for example, a 30-pack-year history of smoking could mean smoking 1 pack a day for 30 years or 2 packs a day for 15 years). Yearly screening should continue until the smoker has stopped smoking for at least 15 years. Yearly screening should be stopped for people who develop a health problem that would prevent them from having lung cancer treatment.  If you choose to drink alcohol, do not have more than 2 drinks per day. One drink is considered to be 12 oz (360 mL) of beer, 5 oz (150 mL) of wine, or 1.5 oz (45 mL) of liquor.  Avoid the use of street drugs. Do not share needles with anyone. Ask for help if you need   support or instructions about stopping the use of drugs.  High blood pressure causes heart disease and increases the risk of stroke. High blood pressure is more likely to develop in:  People who have blood pressure in the end of the normal range (100-139/85-89 mm Hg).  People who are overweight or obese.  People who are African American.  If you are 18-39 years of age, have your blood pressure checked every 3-5 years. If you are 40 years of age or older, have your blood pressure checked every year. You should have your blood pressure  measured twice--once when you are at a hospital or clinic, and once when you are not at a hospital or clinic. Record the average of the two measurements. To check your blood pressure when you are not at a hospital or clinic, you can use:  An automated blood pressure machine at a pharmacy.  A home blood pressure monitor.  If you are 45-79 years old, ask your health care provider if you should take aspirin to prevent heart disease.  Diabetes screening involves taking a blood sample to check your fasting blood sugar level. This should be done once every 3 years after age 45 if you are at a normal weight and without risk factors for diabetes. Testing should be considered at a younger age or be carried out more frequently if you are overweight and have at least 1 risk factor for diabetes.  Colorectal cancer can be detected and often prevented. Most routine colorectal cancer screening begins at the age of 50 and continues through age 75. However, your health care provider may recommend screening at an earlier age if you have risk factors for colon cancer. On a yearly basis, your health care provider may provide home test kits to check for hidden blood in the stool. A small camera at the end of a tube may be used to directly examine the colon (sigmoidoscopy or colonoscopy) to detect the earliest forms of colorectal cancer. Talk to your health care provider about this at age 50 when routine screening begins. A direct exam of the colon should be repeated every 5-10 years through age 75, unless early forms of precancerous polyps or small growths are found.  People who are at an increased risk for hepatitis B should be screened for this virus. You are considered at high risk for hepatitis B if:  You were born in a country where hepatitis B occurs often. Talk with your health care provider about which countries are considered high risk.  Your parents were born in a high-risk country and you have not received a  shot to protect against hepatitis B (hepatitis B vaccine).  You have HIV or AIDS.  You use needles to inject street drugs.  You live with, or have sex with, someone who has hepatitis B.  You are a man who has sex with other men (MSM).  You get hemodialysis treatment.  You take certain medicines for conditions like cancer, organ transplantation, and autoimmune conditions.  Hepatitis C blood testing is recommended for all people born from 1945 through 1965 and any individual with known risk factors for hepatitis C.  Healthy men should no longer receive prostate-specific antigen (PSA) blood tests as part of routine cancer screening. Talk to your health care provider about prostate cancer screening.  Testicular cancer screening is not recommended for adolescents or adult males who have no symptoms. Screening includes self-exam, a health care provider exam, and other screening tests. Consult with your   health care provider about any symptoms you have or any concerns you have about testicular cancer.  Practice safe sex. Use condoms and avoid high-risk sexual practices to reduce the spread of sexually transmitted infections (STIs).  You should be screened for STIs, including gonorrhea and chlamydia if:  You are sexually active and are younger than 24 years.  You are older than 24 years, and your health care provider tells you that you are at risk for this type of infection.  Your sexual activity has changed since you were last screened, and you are at an increased risk for chlamydia or gonorrhea. Ask your health care provider if you are at risk.  If you are at risk of being infected with HIV, it is recommended that you take a prescription medicine daily to prevent HIV infection. This is called pre-exposure prophylaxis (PrEP). You are considered at risk if:  You are a man who has sex with other men (MSM).  You are a heterosexual man who is sexually active with multiple partners.  You take  drugs by injection.  You are sexually active with a partner who has HIV.  Talk with your health care provider about whether you are at high risk of being infected with HIV. If you choose to begin PrEP, you should first be tested for HIV. You should then be tested every 3 months for as long as you are taking PrEP.  Use sunscreen. Apply sunscreen liberally and repeatedly throughout the day. You should seek shade when your shadow is shorter than you. Protect yourself by wearing long sleeves, pants, a wide-brimmed hat, and sunglasses year round whenever you are outdoors.  Tell your health care provider of new moles or changes in moles, especially if there is a change in shape or color. Also, tell your health care provider if a mole is larger than the size of a pencil eraser.  A one-time screening for abdominal aortic aneurysm (AAA) and surgical repair of large AAAs by ultrasound is recommended for men aged 65-75 years who are current or former smokers.  Stay current with your vaccines (immunizations).   This information is not intended to replace advice given to you by your health care provider. Make sure you discuss any questions you have with your health care provider.   Document Released: 01/25/2008 Document Revised: 08/19/2014 Document Reviewed: 12/24/2010 Elsevier Interactive Patient Education 2016 [employer]  

## 2015-11-30 NOTE — Assessment & Plan Note (Signed)
1) Anticipatory Guidance: Discussed importance of wearing a seatbelt while driving and not texting while driving; changing batteries in smoke detector at least once annually; wearing suntan lotion when outside; eating a balanced and moderate diet; getting physical activity at least 30 minutes per day.  2) Immunizations / Screenings / Labs:  All immunizations are up to date per recommendations. All screenings are up to date per recommendations. Obtain CBC, CMET, Lipid profile and TSH.   Overall well exam with risk factors for cardiovascular including hyperlipidemia in the past and managed with lifestyle. Eats low fat diet that is moderate, varied and balanced. Exercises regularly. He is of good weight. Continue healthy lifestyle behaviors and choices. Follow up prevention exam in 1 year. Follow up office visit pending blood work as needed.

## 2015-11-30 NOTE — Progress Notes (Signed)
Subjective:    Patient ID: Travis DesanctisDouglas S Schmuhl, male    DOB: 25-Jan-1971, 45 y.o.   MRN: [id number]  Chief Complaint  Patient presents with  . CPE    fasting    HPI:  Travis Reeves is a 45 y.o. male who presents today for an annual wellness visit.   1) Health Maintenance -   Diet - Averages about 2-3 meals per day consisting of chicken, fish, vegetables, and occasional fruit; 6-8 cups of caffeine per day  Exercise - Regularly; run and plays basketball and golf   2) Preventative Exams / Immunizations:  Dental -- Up to date  Vision -- Up to date   Health Maintenance  Topic Date Due  . HIV Screening  08/31/1987  . INFLUENZA VACCINE  03/12/2016  . TETANUS/TDAP  03/06/2023    Immunization History  Administered Date(s) Administered  . Td 08/12/2001  . Tdap 03/05/2013    Allergies  Allergen Reactions  . Zithromax [Azithromycin Dihydrate]     Nausea & vomiting     Outpatient Prescriptions Prior to Visit  Medication Sig Dispense Refill  . fluticasone (FLONASE) 50 MCG/ACT nasal spray APPOINTMENT OVERDUE, INHALE 1 SPRAY IN EACH NOSTRIL TWICE DAILY 16 g 5  . ibuprofen (ADVIL,MOTRIN) 200 MG tablet Take 200 mg by mouth every 6 (six) hours as needed.      Marland Kitchen. aspirin 81 MG tablet Take 81 mg by mouth daily.      Marland Kitchen. loratadine (CLARITIN) 10 MG tablet Take 10 mg by mouth daily.       No facility-administered medications prior to visit.     Past Medical History  Diagnosis Date  . Anxiety     PMH of  . Dyspepsia   . H/O seasonal allergies   . Allergy      Past Surgical History  Procedure Laterality Date  . Vasectomy       Family History  Problem Relation Age of Onset  . Hypertension Mother   . Hyperlipidemia Mother   . Diabetes Mother   . Migraines Mother   . Pancreatic cancer Mother     died [id number]  . Depression Father   . Arthritis Father   . Hypertension Father   . Hyperlipidemia Father   . Heart attack Father     > 55; S/P 2 stents  . Diabetes  Father   . Colon polyps Father   . Breast cancer Paternal Aunt   . Cancer Paternal Grandmother     colon & stomach  . Transient ischemic attack Paternal Grandfather   . Allergy (severe) Son     on shots     Social History   Social History  . Marital Status: Married    Spouse Name: N/A  . Number of Children: 2  . Years of Education: 16   Occupational History  . Sales Rep    Social History Main Topics  . Smoking status: Never Smoker   . Smokeless tobacco: Never Used  . Alcohol Use: Yes     Comment:  occasionally  . Drug Use: No  . Sexual Activity: Not on file   Other Topics Concern  . Not on file   Social History Narrative   Fun: Golf and exercise    Review of Systems  Constitutional: Denies fever, chills, fatigue, or significant weight gain/loss. HENT: Head: Denies headache or neck pain Ears: Denies changes in hearing, ringing in ears, earache, drainage Nose: Denies discharge, stuffiness, itching, nosebleed, sinus pain Throat: Denies  sore throat, hoarseness, dry mouth, sores, thrush Eyes: Denies loss/changes in vision, pain, redness, blurry/double vision, flashing lights Cardiovascular: Denies chest pain/discomfort, tightness, palpitations, shortness of breath with activity, difficulty lying down, swelling, sudden awakening with shortness of breath Respiratory: Denies shortness of breath, cough, sputum production, wheezing Gastrointestinal: Denies dysphasia, heartburn, change in appetite, nausea, change in bowel habits, rectal bleeding, constipation, diarrhea, yellow skin or eyes Genitourinary: Denies frequency, urgency, burning/pain, blood in urine, incontinence, change in urinary strength. Musculoskeletal: Denies muscle/joint pain, stiffness, back pain, redness or swelling of joints, trauma Skin: Denies rashes, lumps, itching, dryness, color changes, or hair/nail changes Neurological: Denies dizziness, fainting, seizures, weakness, numbness, tingling,  tremor Psychiatric - Denies nervousness, stress, depression or memory loss Endocrine: Denies heat or cold intolerance, sweating, frequent urination, excessive thirst, changes in appetite Hematologic: Denies ease of bruising or bleeding     Objective:     BP 108/80 mmHg  Pulse 54  Temp(Src) 97.9 F (36.6 C) (Oral)  Resp 16  Ht 5' 9.5" (1.765 m)  Wt 146 lb (66.225 kg)  BMI 21.26 kg/m2  SpO2 98% Nursing note and vital signs reviewed.  Physical Exam  Constitutional: He is oriented to person, place, and time. He appears well-developed and well-nourished.  HENT:  Head: Normocephalic.  Right Ear: Hearing, tympanic membrane, external ear and ear canal normal.  Left Ear: Hearing, tympanic membrane, external ear and ear canal normal.  Nose: Nose normal.  Mouth/Throat: Uvula is midline, oropharynx is clear and moist and mucous membranes are normal.  Eyes: Conjunctivae and EOM are normal. Pupils are equal, round, and reactive to light.  Neck: Neck supple. No JVD present. No tracheal deviation present. No thyromegaly present.  Cardiovascular: Normal rate, regular rhythm, normal heart sounds and intact distal pulses.   Pulmonary/Chest: Effort normal and breath sounds normal.  Abdominal: Soft. Bowel sounds are normal. He exhibits no distension and no mass. There is no tenderness. There is no rebound and no guarding.  Musculoskeletal: Normal range of motion. He exhibits no edema or tenderness.  Lymphadenopathy:    He has no cervical adenopathy.  Neurological: He is alert and oriented to person, place, and time. He has normal reflexes. No cranial nerve deficit. He exhibits normal muscle tone. Coordination normal.  Skin: Skin is warm and dry.  Psychiatric: He has a normal mood and affect. His behavior is normal. Judgment and thought content normal.       Assessment & Plan:   Problem List Items Addressed This Visit      Other   Routine general medical examination at a health care facility  - Primary    1) Anticipatory Guidance: Discussed importance of wearing a seatbelt while driving and not texting while driving; changing batteries in smoke detector at least once annually; wearing suntan lotion when outside; eating a balanced and moderate diet; getting physical activity at least 30 minutes per day.  2) Immunizations / Screenings / Labs:  All immunizations are up to date per recommendations. All screenings are up to date per recommendations. Obtain CBC, CMET, Lipid profile and TSH.   Overall well exam with risk factors for cardiovascular including hyperlipidemia in the past and managed with lifestyle. Eats low fat diet that is moderate, varied and balanced. Exercises regularly. He is of good weight. Continue healthy lifestyle behaviors and choices. Follow up prevention exam in 1 year. Follow up office visit pending blood work as needed.       Relevant Orders   CBC   Comprehensive metabolic  panel   Lipid panel   TSH

## 2015-11-30 NOTE — Telephone Encounter (Signed)
Please inform patient that his blood work shows that his kidney function, liver function, electrolytes, thyroid function, white/red blood cells and cholesterol are within the normal limits. Therefore no further action is required at this time and he can plan to follow up in 1 year.   

## 2015-12-04 NOTE — Telephone Encounter (Signed)
LVM letting pt know.  

## 2016-01-02 ENCOUNTER — Telehealth: Payer: Self-pay | Admitting: Family

## 2016-01-02 ENCOUNTER — Ambulatory Visit (INDEPENDENT_AMBULATORY_CARE_PROVIDER_SITE_OTHER)
Admission: RE | Admit: 2016-01-02 | Discharge: 2016-01-02 | Disposition: A | Discharge status: Home / Self Care | Payer: BLUE CROSS/BLUE SHIELD | Source: Ambulatory Visit | Attending: Family | Admitting: Family

## 2016-01-02 ENCOUNTER — Ambulatory Visit (INDEPENDENT_AMBULATORY_CARE_PROVIDER_SITE_OTHER): Payer: BLUE CROSS/BLUE SHIELD | Admitting: Family

## 2016-01-02 ENCOUNTER — Encounter: Payer: Self-pay | Admitting: Family

## 2016-01-02 VITALS — BP 110/70 | HR 62 | Temp 98.2°F | Resp 16 | Ht 69.5 in | Wt 148.0 lb

## 2016-01-02 DIAGNOSIS — M79644 Pain in right finger(s): Secondary | ICD-10-CM

## 2016-01-02 NOTE — Assessment & Plan Note (Signed)
Right finger pain consistent with possible sprain of the right DIP joint of the second finger. Obtain x-rays to rule out fracture. Treat conservatively with ice and buddy taping as needed.

## 2016-01-02 NOTE — Progress Notes (Signed)
Subjective:    Patient ID: Travis Reeves, male    DOB: [birth year], 45 y.o.   MRN: [id number]  Chief Complaint  Patient presents with  . Hand Pain    hurt pointer right pointer finger about a month ago and would like it checked out it is still bothering him, abdominal pain has improved    HPI:  Travis Reeves is a 45 y.o. male who  has a past medical history of Anxiety; Dyspepsia; H/O seasonal allergies; and Allergy. and presents today for an acute office visit.   This is a new problem. Associated symptom of pain located in his right second finger has been going on for about 1 month. Describes that he was playing basketball when the ball hit his finger. Modifying factors include cold compresses which helped a little. Pain is described as an occasional throbbing ache and feels additional discomfort when he bends his finger. He is right hand dominant. Able to function. Denies any sounds/sensations heard or felt.   Allergies  Allergen Reactions  . Zithromax [Azithromycin Dihydrate]     Nausea & vomiting     Current Outpatient Prescriptions on File Prior to Visit  Medication Sig Dispense Refill  . fluticasone (FLONASE) 50 MCG/ACT nasal spray APPOINTMENT OVERDUE, INHALE 1 SPRAY IN EACH NOSTRIL TWICE DAILY 16 g 5  . ibuprofen (ADVIL,MOTRIN) 200 MG tablet Take 200 mg by mouth every 6 (six) hours as needed.       No current facility-administered medications on file prior to visit.    Review of Systems  Constitutional: Negative for fever and chills.  Musculoskeletal:       Positive for right finger pain  Neurological: Negative for weakness and numbness.      Objective:    BP 110/70 mmHg  Pulse 62  Temp(Src) 98.2 F (36.8 C) (Oral)  Resp 16  Ht 5' 9.5" (1.765 m)  Wt 148 lb (67.132 kg)  BMI 21.55 kg/m2  SpO2 97% Nursing note and vital signs reviewed.  Physical Exam  Constitutional: He is oriented to person, place, and time. He appears well-developed and  well-nourished. No distress.  Cardiovascular: Normal rate, regular rhythm, normal heart sounds and intact distal pulses.   Pulmonary/Chest: Effort normal and breath sounds normal.  Musculoskeletal:  Right 2nd finger - Mild edema around the right 2nd DIP with no obvious deformity or discoloration. There is no tenderness elicited. Range of motion is within normal limits. Capillary refill is intact and appropriate.   Neurological: He is alert and oriented to person, place, and time.  Skin: Skin is warm and dry.  Psychiatric: He has a normal mood and affect. His behavior is normal. Judgment and thought content normal.       Assessment & Plan:   Problem List Items Addressed This Visit      Other   Finger pain, right - Primary    Right finger pain consistent with possible sprain of the right DIP joint of the second finger. Obtain x-rays to rule out fracture. Treat conservatively with ice and buddy taping as needed.      Relevant Orders   DG Hand Complete Right      I have discontinued Travis Reeves fexofenadine. I am also having him maintain his ibuprofen, fluticasone, and levocetirizine.   Meds ordered this encounter  Medications  . levocetirizine (XYZAL) 5 MG tablet    Sig: Take 5 mg by mouth every evening.     Follow-up: Return if symptoms worsen or fail  to improve.  Jeanine Luzalone, Ioana Louks, FNP

## 2016-01-02 NOTE — Patient Instructions (Signed)
Thank you for choosing [facility].  Summary/Instructions:  Please stop by radiology on the basement level of the building for your x-rays. Your results will be released to MyChart (or called to you) after review, usually within 72 hours after test completion. If any treatments or changes are necessary, you will be notified at that same time.  If your symptoms worsen or fail to improve, please contact our office for further instruction, or in case of emergency go directly to the emergency room at the closest medical facility.   Please ice as needed for discomfort.   Buddy tape as needed.

## 2016-01-02 NOTE — Telephone Encounter (Signed)
Please inform patient that his x-ray does show a tiny avulsion fracture. There is no treatment needed for this given his range of motion and lack of pain. I would anticipate the swelling to be there for several weeks as it heals, but there is nothing that is displaced or needing to be fixed. He can follow up as needed.

## 2016-01-02 NOTE — Progress Notes (Signed)
Pre visit review using our clinic review tool, if applicable. No additional management support is needed unless otherwise documented below in the visit note. 

## 2016-01-03 NOTE — Telephone Encounter (Signed)
LVM for pt to call back for results.

## 2016-01-12 NOTE — Telephone Encounter (Signed)
Results sent in the mail. 

## 2016-01-18 ENCOUNTER — Encounter: Payer: Self-pay | Admitting: Family

## 2016-01-18 ENCOUNTER — Ambulatory Visit (INDEPENDENT_AMBULATORY_CARE_PROVIDER_SITE_OTHER): Payer: BLUE CROSS/BLUE SHIELD | Admitting: Family

## 2016-01-18 VITALS — BP 100/62 | HR 67 | Temp 97.8°F | Ht 69.5 in | Wt 147.5 lb

## 2016-01-18 DIAGNOSIS — R05 Cough: Secondary | ICD-10-CM

## 2016-01-18 DIAGNOSIS — R062 Wheezing: Secondary | ICD-10-CM | POA: Diagnosis not present

## 2016-01-18 DIAGNOSIS — J209 Acute bronchitis, unspecified: Secondary | ICD-10-CM | POA: Diagnosis not present

## 2016-01-18 DIAGNOSIS — R059 Cough, unspecified: Secondary | ICD-10-CM

## 2016-01-18 MED ORDER — PREDNISONE 10 MG PO TABS: ORAL_TABLET | ORAL | Status: DC

## 2016-01-18 MED ORDER — ALBUTEROL SULFATE HFA 108 (90 BASE) MCG/ACT IN AERS: 2.0000 | INHALATION_SPRAY | Freq: Four times a day (QID) | RESPIRATORY_TRACT | Status: DC | PRN

## 2016-01-18 MED ORDER — HYDROCODONE-HOMATROPINE 5-1.5 MG/5ML PO SYRP: 5.0000 mL | ORAL_SOLUTION | Freq: Every evening | ORAL | Status: DC | PRN

## 2016-01-18 NOTE — Progress Notes (Signed)
Subjective:    Patient ID: Travis DesanctisDouglas S Scheel, male    DOB: 03-04-1971, 45 y.o.   MRN: [id number]   Travis DesanctisDouglas S Ress is a 45 y.o. male who presents today for an acute visit.    HPI Comments: Had seen by an urgent walk-in clinic for cough 2 weeks ago; had clear CXR and was given tessalon, albuterol, hycodan with some relief.    URI  This is a new problem. The current episode started 1 to 4 weeks ago. The problem has been waxing and waning. There has been no fever. Associated symptoms include congestion, coughing and wheezing. Pertinent negatives include no chest pain, diarrhea, dysuria, ear pain, headaches, nausea, rash, rhinorrhea, sore throat or vomiting. Treatments tried: tessalon, hycodan. The treatment provided mild relief.   Past Medical History  Diagnosis Date  . Anxiety     PMH of  . Dyspepsia   . H/O seasonal allergies   . Allergy    Allergies: Zithromax Current Outpatient Prescriptions on File Prior to Visit  Medication Sig Dispense Refill  . fluticasone (FLONASE) 50 MCG/ACT nasal spray APPOINTMENT OVERDUE, INHALE 1 SPRAY IN EACH NOSTRIL TWICE DAILY 16 g 5  . ibuprofen (ADVIL,MOTRIN) 200 MG tablet Take 200 mg by mouth every 6 (six) hours as needed.      Marland Kitchen. levocetirizine (XYZAL) 5 MG tablet Take 5 mg by mouth every evening.     No current facility-administered medications on file prior to visit.    Social History  Substance Use Topics  . Smoking status: Never Smoker   . Smokeless tobacco: Never Used  . Alcohol Use: Yes     Comment:  occasionally    Review of Systems  Constitutional: Negative for fever and chills.  HENT: Positive for congestion. Negative for ear pain, rhinorrhea, sinus pressure and sore throat.   Respiratory: Positive for cough, shortness of breath and wheezing.   Cardiovascular: Negative for chest pain and palpitations.  Gastrointestinal: Negative for nausea, vomiting and diarrhea.  Genitourinary: Negative for dysuria.  Musculoskeletal: Negative  for myalgias.  Skin: Negative for rash.  Neurological: Negative for headaches.  Hematological: Negative for adenopathy.      Objective:    BP 100/62 mmHg  Pulse 67  Temp(Src) 97.8 F (36.6 C) (Oral)  Ht 5' 9.5" (1.765 m)  Wt 147 lb 8 oz (66.906 kg)  BMI 21.48 kg/m2  SpO2 97%   Physical Exam  Constitutional: Vital signs are normal. He appears well-developed and well-nourished.  HENT:  Head: Normocephalic and atraumatic.  Right Ear: Hearing, tympanic membrane, external ear and ear canal normal. No drainage, swelling or tenderness. Tympanic membrane is not injected, not erythematous and not bulging. No middle ear effusion. No decreased hearing is noted.  Left Ear: Hearing, tympanic membrane, external ear and ear canal normal. No drainage, swelling or tenderness. Tympanic membrane is not injected, not erythematous and not bulging.  No middle ear effusion. No decreased hearing is noted.  Nose: Nose normal. Right sinus exhibits no maxillary sinus tenderness and no frontal sinus tenderness. Left sinus exhibits no maxillary sinus tenderness and no frontal sinus tenderness.  Mouth/Throat: Uvula is midline, oropharynx is clear and moist and mucous membranes are normal. No oropharyngeal exudate, posterior oropharyngeal edema, posterior oropharyngeal erythema or tonsillar abscesses.  Eyes: Conjunctivae are normal.  Cardiovascular: Regular rhythm and normal heart sounds.   Pulmonary/Chest: Effort normal and breath sounds normal. No respiratory distress. He has no wheezes. He has no rhonchi. He has no rales.  Lymphadenopathy:  Head (right side): No submental, no submandibular, no tonsillar, no preauricular, no posterior auricular and no occipital adenopathy present.       Head (left side): No submental, no submandibular, no tonsillar, no preauricular, no posterior auricular and no occipital adenopathy present.    He has no cervical adenopathy.  Neurological: He is alert.  Skin: Skin is warm  and dry.  Psychiatric: He has a normal mood and affect. His speech is normal and behavior is normal.  Vitals reviewed.      Assessment & Plan:  1. Wheezing/Bronchospasm with bronchitis, acute/2. Cough Lung sounds clear. Recent chest x-ray negative for pneumonia per patient. Patient is afebrile. Working diagnosis of viral URI. Alternately, post viral cough.   - albuterol (PROVENTIL HFA) 108 (90 Base) MCG/ACT inhaler; Inhale 2 puffs into the lungs every 6 (six) hours as needed for wheezing or shortness of breath.  Dispense: 1 Inhaler; Refill: 1 - predniSONE (DELTASONE) 10 MG tablet; Take 4 tablets ( total 40 mg) by mouth for 2 days; take 3 tablets ( total 30 mg) by mouth for 2 days; take 2 tablets ( total 20 mg) by mouth for 1 day; take 1 tablet ( total 10 mg) by mouth for 1 day.  Dispense: 17 tablet; Refill: 0   - HYDROcodone-homatropine (HYCODAN) 5-1.5 MG/5ML syrup; Take 5 mLs by mouth at bedtime as needed for cough.  Dispense: 40 mL; Refill: 0   - predniSONE (DELTASONE) 10 MG tablet; Take 4 tablets ( total 40 mg) by mouth for 2 days; take 3 tablets ( total 30 mg) by mouth for 2 days; take 2 tablets ( total 20 mg) by mouth for 1 day; take 1 tablet ( total 10 mg) by mouth for 1 day.  Dispense: 17 tablet; Refill: 0     I am having Mr. Jakubiak start on albuterol, predniSONE, and HYDROcodone-homatropine. I am also having him maintain his ibuprofen, fluticasone, and levocetirizine.   Meds ordered this encounter  Medications  . albuterol (PROVENTIL HFA) 108 (90 Base) MCG/ACT inhaler    Sig: Inhale 2 puffs into the lungs every 6 (six) hours as needed for wheezing or shortness of breath.    Dispense:  1 Inhaler    Refill:  1    Order Specific Question:  Supervising Provider    Answer:  Tresa Garter [[id number]]  . predniSONE (DELTASONE) 10 MG tablet    Sig: Take 4 tablets ( total 40 mg) by mouth for 2 days; take 3 tablets ( total 30 mg) by mouth for 2 days; take 2 tablets ( total 20 mg)  by mouth for 1 day; take 1 tablet ( total 10 mg) by mouth for 1 day.    Dispense:  17 tablet    Refill:  0    Order Specific Question:  Supervising Provider    Answer:  Tresa Garter [[id number]]  . HYDROcodone-homatropine (HYCODAN) 5-1.5 MG/5ML syrup    Sig: Take 5 mLs by mouth at bedtime as needed for cough.    Dispense:  40 mL    Refill:  0    Order Specific Question:  Supervising Provider    Answer:  Tresa Garter [[id number]]     Start medications as prescribed and explained to patient on After Visit Summary ( AVS). Risks, benefits, and alternatives of the medications and treatment plan prescribed today were discussed, and patient expressed understanding.   Education regarding symptom management and diagnosis given to patient.   Follow-up:Plan follow-up and return precautions  given if any worsening symptoms or change in condition.   Continue to follow with Jeanine Luz, FNP for routine health maintenance.   Travis Reeves and I agreed with plan.   Rennie Plowman, FNP

## 2016-01-18 NOTE — Progress Notes (Signed)
Pre visit review using our clinic review tool, if applicable. No additional management support is needed unless otherwise documented below in the visit note. 

## 2016-01-18 NOTE — Patient Instructions (Signed)
Use albuterol every 6 hours for first 24 hours to get good medication into the lungs and loosen congestion; after, you may use as needed and eventually stop all together when cough resolves.  Please take cough medication at night only as needed. As we discussed, I do not recommend dosing throughout the day as coughing is a protective mechanism . It also helps to break up thick mucous.  Do not take cough suppressants with alcohol as can lead to trouble breathing. Advise caution if taking cough suppressant and operating machinery ( i.e driving a car) as you may feel very tired.   Increase intake of clear fluids. Congestion is best treated by hydration, when mucus is wetter, it is thinner, less sticky, and easier to expel from the body, either through coughing up drainage, or by blowing your nose.   Get plenty of rest.   Use saline nasal drops and blow your nose frequently. Run a humidifier at night and elevate the head of the bed. Vicks Vapor rub will help with congestion and cough. Steam showers and sinus massage for congestion.   Use Acetaminophen or Ibuprofen as needed for fever or pain. Avoid second hand smoke. Even the smallest exposure will worsen symptoms.   Over the counter medications you can try include Delsym for cough, a decongestant for congestion, and Mucinex or Robitussin as an expectorant. Be sure to just get the plain Mucinex or Robitussin that just has one medication (Guaifenesen). We don't recommend the combination products. Note, be sure to drink two glasses of water with each dose of Mucinex as the medication will not work well without adequate hydration.   You can also try a teaspoon of honey to see if this will help reduce cough. Throat lozenges can sometimes be beneficial as well.    This illness will typically last 7 - 10 days.   Please follow up with our clinic if you develop a fever greater than 101 F, symptoms worsen, or do not resolve in the next week.    

## 2016-06-11 DIAGNOSIS — J3089 Other allergic rhinitis: Secondary | ICD-10-CM | POA: Diagnosis not present

## 2016-06-11 DIAGNOSIS — J301 Allergic rhinitis due to pollen: Secondary | ICD-10-CM | POA: Diagnosis not present

## 2016-06-11 DIAGNOSIS — J3081 Allergic rhinitis due to animal (cat) (dog) hair and dander: Secondary | ICD-10-CM | POA: Diagnosis not present

## 2016-06-27 DIAGNOSIS — J3081 Allergic rhinitis due to animal (cat) (dog) hair and dander: Secondary | ICD-10-CM | POA: Diagnosis not present

## 2016-06-27 DIAGNOSIS — J3089 Other allergic rhinitis: Secondary | ICD-10-CM | POA: Diagnosis not present

## 2016-07-12 DIAGNOSIS — J301 Allergic rhinitis due to pollen: Secondary | ICD-10-CM | POA: Diagnosis not present

## 2016-07-12 DIAGNOSIS — J3089 Other allergic rhinitis: Secondary | ICD-10-CM | POA: Diagnosis not present

## 2016-07-12 DIAGNOSIS — J3081 Allergic rhinitis due to animal (cat) (dog) hair and dander: Secondary | ICD-10-CM | POA: Diagnosis not present

## 2016-07-19 DIAGNOSIS — J3081 Allergic rhinitis due to animal (cat) (dog) hair and dander: Secondary | ICD-10-CM | POA: Diagnosis not present

## 2016-07-19 DIAGNOSIS — J3089 Other allergic rhinitis: Secondary | ICD-10-CM | POA: Diagnosis not present

## 2016-07-19 DIAGNOSIS — J301 Allergic rhinitis due to pollen: Secondary | ICD-10-CM | POA: Diagnosis not present

## 2016-07-26 DIAGNOSIS — J3081 Allergic rhinitis due to animal (cat) (dog) hair and dander: Secondary | ICD-10-CM | POA: Diagnosis not present

## 2016-07-26 DIAGNOSIS — J301 Allergic rhinitis due to pollen: Secondary | ICD-10-CM | POA: Diagnosis not present

## 2016-07-26 DIAGNOSIS — J3089 Other allergic rhinitis: Secondary | ICD-10-CM | POA: Diagnosis not present

## 2016-08-01 DIAGNOSIS — J3089 Other allergic rhinitis: Secondary | ICD-10-CM | POA: Diagnosis not present

## 2016-08-01 DIAGNOSIS — J3081 Allergic rhinitis due to animal (cat) (dog) hair and dander: Secondary | ICD-10-CM | POA: Diagnosis not present

## 2016-08-13 ENCOUNTER — Other Ambulatory Visit: Payer: Self-pay

## 2016-08-13 DIAGNOSIS — J3081 Allergic rhinitis due to animal (cat) (dog) hair and dander: Secondary | ICD-10-CM | POA: Diagnosis not present

## 2016-08-13 DIAGNOSIS — J301 Allergic rhinitis due to pollen: Secondary | ICD-10-CM | POA: Diagnosis not present

## 2016-08-13 DIAGNOSIS — J3089 Other allergic rhinitis: Secondary | ICD-10-CM | POA: Diagnosis not present

## 2016-08-13 MED ORDER — FLUTICASONE PROPIONATE 50 MCG/ACT NA SUSP: NASAL | 5 refills | Status: DC

## 2016-08-15 DIAGNOSIS — R05 Cough: Secondary | ICD-10-CM | POA: Diagnosis not present

## 2016-08-15 DIAGNOSIS — J301 Allergic rhinitis due to pollen: Secondary | ICD-10-CM | POA: Diagnosis not present

## 2016-08-15 DIAGNOSIS — J3081 Allergic rhinitis due to animal (cat) (dog) hair and dander: Secondary | ICD-10-CM | POA: Diagnosis not present

## 2016-08-15 DIAGNOSIS — J3089 Other allergic rhinitis: Secondary | ICD-10-CM | POA: Diagnosis not present

## 2016-08-20 ENCOUNTER — Other Ambulatory Visit: Payer: Self-pay | Admitting: *Deleted

## 2016-08-20 MED ORDER — FLUTICASONE PROPIONATE 50 MCG/ACT NA SUSP: 1.0000 | Freq: Every day | NASAL | 0 refills | Status: DC

## 2016-08-23 DIAGNOSIS — J301 Allergic rhinitis due to pollen: Secondary | ICD-10-CM | POA: Diagnosis not present

## 2016-08-23 DIAGNOSIS — J3081 Allergic rhinitis due to animal (cat) (dog) hair and dander: Secondary | ICD-10-CM | POA: Diagnosis not present

## 2016-08-23 DIAGNOSIS — J3089 Other allergic rhinitis: Secondary | ICD-10-CM | POA: Diagnosis not present

## 2016-09-06 DIAGNOSIS — J1089 Influenza due to other identified influenza virus with other manifestations: Secondary | ICD-10-CM | POA: Diagnosis not present

## 2016-09-12 DIAGNOSIS — J3089 Other allergic rhinitis: Secondary | ICD-10-CM | POA: Diagnosis not present

## 2016-09-12 DIAGNOSIS — J3081 Allergic rhinitis due to animal (cat) (dog) hair and dander: Secondary | ICD-10-CM | POA: Diagnosis not present

## 2016-09-12 DIAGNOSIS — J301 Allergic rhinitis due to pollen: Secondary | ICD-10-CM | POA: Diagnosis not present

## 2016-09-17 DIAGNOSIS — J301 Allergic rhinitis due to pollen: Secondary | ICD-10-CM | POA: Diagnosis not present

## 2016-09-17 DIAGNOSIS — J3089 Other allergic rhinitis: Secondary | ICD-10-CM | POA: Diagnosis not present

## 2016-09-17 DIAGNOSIS — J3081 Allergic rhinitis due to animal (cat) (dog) hair and dander: Secondary | ICD-10-CM | POA: Diagnosis not present

## 2016-09-23 DIAGNOSIS — J3089 Other allergic rhinitis: Secondary | ICD-10-CM | POA: Diagnosis not present

## 2016-09-23 DIAGNOSIS — J301 Allergic rhinitis due to pollen: Secondary | ICD-10-CM | POA: Diagnosis not present

## 2016-09-23 DIAGNOSIS — J3081 Allergic rhinitis due to animal (cat) (dog) hair and dander: Secondary | ICD-10-CM | POA: Diagnosis not present

## 2016-10-03 DIAGNOSIS — J3081 Allergic rhinitis due to animal (cat) (dog) hair and dander: Secondary | ICD-10-CM | POA: Diagnosis not present

## 2016-10-03 DIAGNOSIS — J301 Allergic rhinitis due to pollen: Secondary | ICD-10-CM | POA: Diagnosis not present

## 2016-10-03 DIAGNOSIS — J3089 Other allergic rhinitis: Secondary | ICD-10-CM | POA: Diagnosis not present

## 2016-10-10 DIAGNOSIS — J3089 Other allergic rhinitis: Secondary | ICD-10-CM | POA: Diagnosis not present

## 2016-10-10 DIAGNOSIS — J301 Allergic rhinitis due to pollen: Secondary | ICD-10-CM | POA: Diagnosis not present

## 2016-10-10 DIAGNOSIS — J3081 Allergic rhinitis due to animal (cat) (dog) hair and dander: Secondary | ICD-10-CM | POA: Diagnosis not present

## 2016-10-16 DIAGNOSIS — J301 Allergic rhinitis due to pollen: Secondary | ICD-10-CM | POA: Diagnosis not present

## 2016-10-16 DIAGNOSIS — J3089 Other allergic rhinitis: Secondary | ICD-10-CM | POA: Diagnosis not present

## 2016-10-16 DIAGNOSIS — J3081 Allergic rhinitis due to animal (cat) (dog) hair and dander: Secondary | ICD-10-CM | POA: Diagnosis not present

## 2016-10-24 DIAGNOSIS — J301 Allergic rhinitis due to pollen: Secondary | ICD-10-CM | POA: Diagnosis not present

## 2016-10-24 DIAGNOSIS — J3089 Other allergic rhinitis: Secondary | ICD-10-CM | POA: Diagnosis not present

## 2016-10-30 DIAGNOSIS — J3081 Allergic rhinitis due to animal (cat) (dog) hair and dander: Secondary | ICD-10-CM | POA: Diagnosis not present

## 2016-10-30 DIAGNOSIS — J301 Allergic rhinitis due to pollen: Secondary | ICD-10-CM | POA: Diagnosis not present

## 2016-10-30 DIAGNOSIS — J3089 Other allergic rhinitis: Secondary | ICD-10-CM | POA: Diagnosis not present

## 2016-11-06 DIAGNOSIS — J301 Allergic rhinitis due to pollen: Secondary | ICD-10-CM | POA: Diagnosis not present

## 2016-11-06 DIAGNOSIS — J3081 Allergic rhinitis due to animal (cat) (dog) hair and dander: Secondary | ICD-10-CM | POA: Diagnosis not present

## 2016-11-06 DIAGNOSIS — J3089 Other allergic rhinitis: Secondary | ICD-10-CM | POA: Diagnosis not present

## 2016-11-13 DIAGNOSIS — J301 Allergic rhinitis due to pollen: Secondary | ICD-10-CM | POA: Diagnosis not present

## 2016-11-13 DIAGNOSIS — J3081 Allergic rhinitis due to animal (cat) (dog) hair and dander: Secondary | ICD-10-CM | POA: Diagnosis not present

## 2016-11-13 DIAGNOSIS — J3089 Other allergic rhinitis: Secondary | ICD-10-CM | POA: Diagnosis not present

## 2016-11-20 DIAGNOSIS — J3089 Other allergic rhinitis: Secondary | ICD-10-CM | POA: Diagnosis not present

## 2016-11-20 DIAGNOSIS — J301 Allergic rhinitis due to pollen: Secondary | ICD-10-CM | POA: Diagnosis not present

## 2016-11-20 DIAGNOSIS — J3081 Allergic rhinitis due to animal (cat) (dog) hair and dander: Secondary | ICD-10-CM | POA: Diagnosis not present

## 2016-11-22 ENCOUNTER — Telehealth: Payer: Self-pay | Admitting: Family

## 2016-11-22 DIAGNOSIS — J3089 Other allergic rhinitis: Secondary | ICD-10-CM | POA: Diagnosis not present

## 2016-11-22 DIAGNOSIS — J3081 Allergic rhinitis due to animal (cat) (dog) hair and dander: Secondary | ICD-10-CM | POA: Diagnosis not present

## 2016-11-22 DIAGNOSIS — J301 Allergic rhinitis due to pollen: Secondary | ICD-10-CM | POA: Diagnosis not present

## 2016-11-22 NOTE — Telephone Encounter (Signed)
OK with me.

## 2016-11-22 NOTE — Telephone Encounter (Signed)
Ok with me 

## 2016-11-22 NOTE — Telephone Encounter (Signed)
Left message for patient to call back to schedule appt.

## 2016-11-22 NOTE — Telephone Encounter (Signed)
Patient lives closer to [facility] office and is requesting to switch from Marcos Eke, FNP to Dr. Milinda Cave. Please advise if ok.

## 2016-11-29 DIAGNOSIS — J301 Allergic rhinitis due to pollen: Secondary | ICD-10-CM | POA: Diagnosis not present

## 2016-11-29 DIAGNOSIS — J3081 Allergic rhinitis due to animal (cat) (dog) hair and dander: Secondary | ICD-10-CM | POA: Diagnosis not present

## 2016-11-29 DIAGNOSIS — J3089 Other allergic rhinitis: Secondary | ICD-10-CM | POA: Diagnosis not present

## 2016-12-02 ENCOUNTER — Telehealth: Payer: Self-pay | Admitting: Family

## 2016-12-02 MED ORDER — FLUTICASONE PROPIONATE 50 MCG/ACT NA SUSP: 1.0000 | Freq: Every day | NASAL | Status: DC

## 2016-12-02 MED ORDER — FLUTICASONE PROPIONATE 50 MCG/ACT NA SUSP: 1.0000 | Freq: Every day | NASAL | 0 refills | Status: AC

## 2016-12-02 NOTE — Telephone Encounter (Signed)
Pt would like a refill of fluticasone (FLONASE) 50 MCG/ACT nasal spray. He has switched providers and is being seen on 5/17 with new provider. Probably only needs 30 day supply.   [facility] in [place] on [address].

## 2016-12-02 NOTE — Telephone Encounter (Signed)
Sent 30 day to [employer] Kathryne Sharper electronically...Raechel Chute

## 2016-12-06 DIAGNOSIS — J3089 Other allergic rhinitis: Secondary | ICD-10-CM | POA: Diagnosis not present

## 2016-12-06 DIAGNOSIS — J301 Allergic rhinitis due to pollen: Secondary | ICD-10-CM | POA: Diagnosis not present

## 2016-12-06 DIAGNOSIS — J3081 Allergic rhinitis due to animal (cat) (dog) hair and dander: Secondary | ICD-10-CM | POA: Diagnosis not present

## 2016-12-13 DIAGNOSIS — J3081 Allergic rhinitis due to animal (cat) (dog) hair and dander: Secondary | ICD-10-CM | POA: Diagnosis not present

## 2016-12-13 DIAGNOSIS — J301 Allergic rhinitis due to pollen: Secondary | ICD-10-CM | POA: Diagnosis not present

## 2016-12-13 DIAGNOSIS — J3089 Other allergic rhinitis: Secondary | ICD-10-CM | POA: Diagnosis not present

## 2016-12-19 DIAGNOSIS — H6502 Acute serous otitis media, left ear: Secondary | ICD-10-CM | POA: Diagnosis not present

## 2016-12-20 DIAGNOSIS — J3081 Allergic rhinitis due to animal (cat) (dog) hair and dander: Secondary | ICD-10-CM | POA: Diagnosis not present

## 2016-12-20 DIAGNOSIS — J3089 Other allergic rhinitis: Secondary | ICD-10-CM | POA: Diagnosis not present

## 2016-12-20 DIAGNOSIS — J301 Allergic rhinitis due to pollen: Secondary | ICD-10-CM | POA: Diagnosis not present

## 2016-12-26 ENCOUNTER — Encounter: Payer: Self-pay | Admitting: Family Medicine

## 2016-12-26 ENCOUNTER — Ambulatory Visit (INDEPENDENT_AMBULATORY_CARE_PROVIDER_SITE_OTHER): Payer: BLUE CROSS/BLUE SHIELD | Admitting: Family Medicine

## 2016-12-26 VITALS — BP 107/73 | HR 70 | Temp 98.1°F | Resp 15 | Ht 68.25 in | Wt 144.8 lb

## 2016-12-26 DIAGNOSIS — Z0000 Encounter for general adult medical examination without abnormal findings: Secondary | ICD-10-CM | POA: Diagnosis not present

## 2016-12-26 DIAGNOSIS — R062 Wheezing: Secondary | ICD-10-CM

## 2016-12-26 MED ORDER — ALBUTEROL SULFATE HFA 108 (90 BASE) MCG/ACT IN AERS: 2.0000 | INHALATION_SPRAY | Freq: Four times a day (QID) | RESPIRATORY_TRACT | 1 refills | Status: DC | PRN

## 2016-12-26 NOTE — Patient Instructions (Signed)
 Health Maintenance, Male A healthy lifestyle and preventive care is important for your health and wellness. Ask your health care provider about what schedule of regular examinations is right for you. What should I know about weight and diet?  Eat a Healthy Diet  Eat plenty of vegetables, fruits, whole grains, low-fat dairy products, and lean protein.  Do not eat a lot of foods high in solid fats, added sugars, or salt. Maintain a Healthy Weight  Regular exercise can help you achieve or maintain a healthy weight. You should:  Do at least 150 minutes of exercise each week. The exercise should increase your heart rate and make you sweat (moderate-intensity exercise).  Do strength-training exercises at least twice a week. Watch Your Levels of Cholesterol and Blood Lipids  Have your blood tested for lipids and cholesterol every 5 years starting at 44 years of age. If you are at high risk for heart disease, you should start having your blood tested when you are 44 years old. You may need to have your cholesterol levels checked more often if:  Your lipid or cholesterol levels are high.  You are older than 44 years of age.  You are at high risk for heart disease. What should I know about cancer screening? Many types of cancers can be detected early and may often be prevented. Lung Cancer  You should be screened every year for lung cancer if:  You are a current smoker who has smoked for at least 30 years.  You are a former smoker who has quit within the past 15 years.  Talk to your health care provider about your screening options, when you should start screening, and how often you should be screened. Colorectal Cancer  Routine colorectal cancer screening usually begins at 44 years of age and should be repeated every 5-10 years until you are 44 years old. You may need to be screened more often if early forms of precancerous polyps or small growths are found. Your health care provider  may recommend screening at an earlier age if you have risk factors for colon cancer.  Your health care provider may recommend using home test kits to check for hidden blood in the stool.  A small camera at the end of a tube can be used to examine your colon (sigmoidoscopy or colonoscopy). This checks for the earliest forms of colorectal cancer. Prostate and Testicular Cancer  Depending on your age and overall health, your health care provider may do certain tests to screen for prostate and testicular cancer.  Talk to your health care provider about any symptoms or concerns you have about testicular or prostate cancer. Skin Cancer  Check your skin from head to toe regularly.  Tell your health care provider about any new moles or changes in moles, especially if:  There is a change in a mole's size, shape, or color.  You have a mole that is larger than a pencil eraser.  Always use sunscreen. Apply sunscreen liberally and repeat throughout the day.  Protect yourself by wearing long sleeves, pants, a wide-brimmed hat, and sunglasses when outside. What should I know about heart disease, diabetes, and high blood pressure?  If you are 18-39 years of age, have your blood pressure checked every 3-5 years. If you are 40 years of age or older, have your blood pressure checked every year. You should have your blood pressure measured twice-once when you are at a hospital or clinic, and once when you are not at   a hospital or clinic. Record the average of the two measurements. To check your blood pressure when you are not at a hospital or clinic, you can use:  An automated blood pressure machine at a pharmacy.  A home blood pressure monitor.  Talk to your health care provider about your target blood pressure.  If you are between 45-79 years old, ask your health care provider if you should take aspirin to prevent heart disease.  Have regular diabetes screenings by checking your fasting blood sugar  level.  If you are at a normal weight and have a low risk for diabetes, have this test once every three years after the age of 45.  If you are overweight and have a high risk for diabetes, consider being tested at a younger age or more often.  A one-time screening for abdominal aortic aneurysm (AAA) by ultrasound is recommended for men aged 65-75 years who are current or former smokers. What should I know about preventing infection? Hepatitis B  If you have a higher risk for hepatitis B, you should be screened for this virus. Talk with your health care provider to find out if you are at risk for hepatitis B infection. Hepatitis C  Blood testing is recommended for:  Everyone born from 1945 through 1965.  Anyone with known risk factors for hepatitis C. Sexually Transmitted Diseases (STDs)  You should be screened each year for STDs including gonorrhea and chlamydia if:  You are sexually active and are younger than 44 years of age.  You are older than 44 years of age and your health care provider tells you that you are at risk for this type of infection.  Your sexual activity has changed since you were last screened and you are at an increased risk for chlamydia or gonorrhea. Ask your health care provider if you are at risk.  Talk with your health care provider about whether you are at high risk of being infected with HIV. Your health care provider may recommend a prescription medicine to help prevent HIV infection. What else can I do?  Schedule regular health, dental, and eye exams.  Stay current with your vaccines (immunizations).  Do not use any tobacco products, such as cigarettes, chewing tobacco, and e-cigarettes. If you need help quitting, ask your health care provider.  Limit alcohol intake to no more than 2 drinks per day. One drink equals 12 ounces of beer, 5 ounces of wine, or 1 ounces of hard liquor.  Do not use street drugs.  Do not share needles.  Ask your health  care provider for help if you need support or information about quitting drugs.  Tell your health care provider if you often feel depressed.  Tell your health care provider if you have ever been abused or do not feel safe at home. This information is not intended to replace advice given to you by your health care provider. Make sure you discuss any questions you have with your health care provider. Document Released: 01/25/2008 Document Revised: 03/27/2016 Document Reviewed: 05/02/2015 Elsevier Interactive Patient Education  2017 Elsevier Inc.  

## 2016-12-26 NOTE — Progress Notes (Signed)
Office Note 12/26/2016  CC:  Chief Complaint  Patient presents with  . Establish Care    transfer from [facility]  . Annual Exam    Pt is not fasting.     HPI:  Travis Reeves is a 46 y.o. male who is here to establish/transfer care from another [facility] provider in [place]. Patient's most recent primary MD: Marcos Eke.  Prior to this he was a patient of Dr. Alwyn Ren, who recently retired. Old records in [facility] EMR were reviewed prior to or during today's visit.  Eye exam schedule for 2 wks from now. Dental preventatives UTD.  Exercise regularly with running and golf. Diet: not focusing on anything in particular.  Past Medical History:  Diagnosis Date  . Allergy   . Anxiety    PMH of  . Dyspepsia   . H/O seasonal allergies   . Mild intermittent asthma     Past Surgical History:  Procedure Laterality Date  . VASECTOMY      Family History  Problem Relation Age of Onset  . Hypertension Mother   . Hyperlipidemia Mother   . Diabetes Mother   . Migraines Mother   . Pancreatic cancer Mother        died 99  . Depression Father   . Arthritis Father   . Hypertension Father   . Hyperlipidemia Father   . Heart attack Father        > 55; S/P 2 stents  . Diabetes Father   . Colon polyps Father   . Cancer Paternal Grandmother        colon & stomach  . Transient ischemic attack Paternal Grandfather   . Breast cancer Paternal Aunt   . Allergy (severe) Son        on shots    Social History   Social History  . Marital status: Married    Spouse name: N/A  . Number of children: 2  . Years of education: 16   Occupational History  . Sales Rep    Social History Main Topics  . Smoking status: Never Smoker  . Smokeless tobacco: Never Used  . Alcohol use Yes     Comment:  occasionally  . Drug use: No  . Sexual activity: Not on file   Other Topics Concern  . Not on file   Social History Narrative   Married, 2 boys.   Educ: Bachelors in agricultural  business.   Occup: Scientist, water quality.   No tob, some weekend alcohol.  No drugs.   Fun: Golf and exercise (running).    Outpatient Encounter Prescriptions as of 12/26/2016  Medication Sig  . albuterol (PROVENTIL HFA) 108 (90 Base) MCG/ACT inhaler Inhale 2 puffs into the lungs every 6 (six) hours as needed for wheezing or shortness of breath.  . fexofenadine (ALLEGRA) 180 MG tablet Take 180 mg by mouth daily.  . fluticasone (FLONASE) 50 MCG/ACT nasal spray Place 1 spray into both nostrils daily. Must see new provider for future refills  . ibuprofen (ADVIL,MOTRIN) 200 MG tablet Take 200 mg by mouth every 6 (six) hours as needed.    . [DISCONTINUED] albuterol (PROVENTIL HFA) 108 (90 Base) MCG/ACT inhaler Inhale 2 puffs into the lungs every 6 (six) hours as needed for wheezing or shortness of breath.  . [DISCONTINUED] HYDROcodone-homatropine (HYCODAN) 5-1.5 MG/5ML syrup Take 5 mLs by mouth at bedtime as needed for cough. (Patient not taking: Reported on 12/26/2016)  . [DISCONTINUED] levocetirizine (XYZAL) 5 MG tablet Take 5 mg  by mouth every evening.  . [DISCONTINUED] predniSONE (DELTASONE) 10 MG tablet Take 4 tablets ( total 40 mg) by mouth for 2 days; take 3 tablets ( total 30 mg) by mouth for 2 days; take 2 tablets ( total 20 mg) by mouth for 1 day; take 1 tablet ( total 10 mg) by mouth for 1 day. (Patient not taking: Reported on 12/26/2016)   No facility-administered encounter medications on file as of 12/26/2016.     Allergies  Allergen Reactions  . Zithromax [Azithromycin Dihydrate]     Nausea & vomiting    ROS Review of Systems  Constitutional: Negative for appetite change, chills, fatigue and fever.  HENT: Negative for congestion, dental problem, ear pain and sore throat.   Eyes: Negative for discharge, redness and visual disturbance.  Respiratory: Negative for cough, chest tightness, shortness of breath and wheezing.   Cardiovascular: Negative for chest pain, palpitations and leg  swelling.  Gastrointestinal: Negative for abdominal pain, blood in stool, diarrhea, nausea and vomiting.  Genitourinary: Negative for difficulty urinating, dysuria, flank pain, frequency, hematuria and urgency.  Musculoskeletal: Negative for arthralgias, back pain, joint swelling, myalgias and neck stiffness.  Skin: Negative for pallor and rash.  Neurological: Negative for dizziness, speech difficulty, weakness and headaches.  Hematological: Negative for adenopathy. Does not bruise/bleed easily.  Psychiatric/Behavioral: Negative for confusion and sleep disturbance. The patient is not nervous/anxious.     PE; Blood pressure 107/73, pulse 70, temperature 98.1 F (36.7 C), temperature source Oral, resp. rate 15, height 5' 8.25" (1.734 m), weight 144 lb 12 oz (65.7 kg), SpO2 95 %. Gen: Alert, well appearing.  Patient is oriented to person, place, time, and situation. AFFECT: pleasant, lucid thought and speech. ENT: Ears: EACs clear, normal epithelium.  TMs with good light reflex and landmarks bilaterally.  Eyes: no injection, icteris, swelling, or exudate.  EOMI, PERRLA. Nose: no drainage or turbinate edema/swelling.  No injection or focal lesion.  Mouth: lips without lesion/swelling.  Oral mucosa pink and moist.  Dentition intact and without obvious caries or gingival swelling.  Oropharynx without erythema, exudate, or swelling.  Neck: supple/nontender.  No LAD, mass, or TM.  Carotid pulses 2+ bilaterally, without bruits. CV: RRR, no m/r/g.   LUNGS: CTA bilat, nonlabored resps, good aeration in all lung fields. ABD: soft, NT, ND, BS normal.  No hepatospenomegaly or mass.  No bruits. EXT: no clubbing, cyanosis, or edema.  Musculoskeletal: no joint swelling, erythema, warmth, or tenderness.  ROM of all joints intact. Skin - no sores or suspicious lesions or rashes or color changes  Pertinent labs:  Lab Results  Component Value Date   TSH 1.07 11/30/2015   Lab Results  Component Value Date    WBC 5.2 11/30/2015   HGB 14.3 11/30/2015   HCT 42.3 11/30/2015   MCV 85.0 11/30/2015   PLT 242.0 11/30/2015   Lab Results  Component Value Date   CREATININE 0.96 11/30/2015   BUN 15 11/30/2015   NA 139 11/30/2015   K 4.7 11/30/2015   CL 104 11/30/2015   CO2 29 11/30/2015   Lab Results  Component Value Date   ALT 16 11/30/2015   AST 19 11/30/2015   ALKPHOS 63 11/30/2015   BILITOT 0.9 11/30/2015   Lab Results  Component Value Date   CHOL 153 11/30/2015   Lab Results  Component Value Date   HDL 52.30 11/30/2015   Lab Results  Component Value Date   LDLCALC 95 11/30/2015   Lab Results  Component  Value Date   TRIG 28.0 11/30/2015   Lab Results  Component Value Date   CHOLHDL 3 11/30/2015    ASSESSMENT AND PLAN:   Transfer pt:  Health maintenance exam: Reviewed age and gender appropriate health maintenance issues (prudent diet, regular exercise, health risks of tobacco and excessive alcohol, use of seatbelts, fire alarms in home, use of sunscreen).  Also reviewed age and gender appropriate health screening as well as vaccine recommendations. Future fasting HP labs ordered. Vaccines UTD.  An After Visit Summary was printed and given to the patient.  Return in about 1 year (around 12/26/2017) for annual CPE (fasting)--also, pt needs lab appt for fasting labs at his earliest convenience.  Signed:  Santiago BumpersPhil Djuan Talton, MD           12/26/2016

## 2016-12-31 ENCOUNTER — Other Ambulatory Visit (INDEPENDENT_AMBULATORY_CARE_PROVIDER_SITE_OTHER): Payer: BLUE CROSS/BLUE SHIELD

## 2016-12-31 DIAGNOSIS — Z0000 Encounter for general adult medical examination without abnormal findings: Secondary | ICD-10-CM

## 2016-12-31 LAB — CBC WITH DIFFERENTIAL/PLATELET
BASOS ABS: 0.1 10*3/uL (ref 0.0–0.1)
Basophils Relative: 0.9 % (ref 0.0–3.0)
EOS ABS: 0.1 10*3/uL (ref 0.0–0.7)
Eosinophils Relative: 1.4 % (ref 0.0–5.0)
HCT: 44 % (ref 39.0–52.0)
Hemoglobin: 14.7 g/dL (ref 13.0–17.0)
Lymphocytes Relative: 15.9 % (ref 12.0–46.0)
Lymphs Abs: 1.5 10*3/uL (ref 0.7–4.0)
MCHC: 33.3 g/dL (ref 30.0–36.0)
MCV: 86.6 fl (ref 78.0–100.0)
MONO ABS: 0.9 10*3/uL (ref 0.1–1.0)
MONOS PCT: 9.3 % (ref 3.0–12.0)
NEUTROS ABS: 6.9 10*3/uL (ref 1.4–7.7)
Neutrophils Relative %: 72.5 % (ref 43.0–77.0)
Platelets: 260 10*3/uL (ref 150.0–400.0)
RBC: 5.08 Mil/uL (ref 4.22–5.81)
RDW: 13.4 % (ref 11.5–15.5)
WBC: 9.5 10*3/uL (ref 4.0–10.5)

## 2016-12-31 LAB — COMPREHENSIVE METABOLIC PANEL
ALK PHOS: 70 U/L (ref 39–117)
ALT: 14 U/L (ref 0–53)
AST: 16 U/L (ref 0–37)
Albumin: 4.4 g/dL (ref 3.5–5.2)
BILIRUBIN TOTAL: 0.8 mg/dL (ref 0.2–1.2)
BUN: 15 mg/dL (ref 6–23)
CO2: 31 meq/L (ref 19–32)
Calcium: 9.3 mg/dL (ref 8.4–10.5)
Chloride: 104 mEq/L (ref 96–112)
Creatinine, Ser: 0.93 mg/dL (ref 0.40–1.50)
GFR: 93.67 mL/min (ref >60.00)
GLUCOSE: 89 mg/dL (ref 70–99)
POTASSIUM: 4.7 meq/L (ref 3.5–5.1)
SODIUM: 141 meq/L (ref 135–145)
TOTAL PROTEIN: 6.5 g/dL (ref 6.0–8.3)

## 2016-12-31 LAB — LIPID PANEL
CHOLESTEROL: 139 mg/dL (ref 0–200)
HDL: 49 mg/dL (ref >39.00)
LDL Cholesterol: 81 mg/dL (ref 0–99)
NonHDL: 90.21
Total CHOL/HDL Ratio: 3
Triglycerides: 45 mg/dL (ref 0.0–149.0)
VLDL: 9 mg/dL (ref 0.0–40.0)

## 2016-12-31 LAB — TSH: TSH: 1.24 u[IU]/mL (ref 0.35–4.50)

## 2017-01-03 DIAGNOSIS — J301 Allergic rhinitis due to pollen: Secondary | ICD-10-CM | POA: Diagnosis not present

## 2017-01-03 DIAGNOSIS — J3089 Other allergic rhinitis: Secondary | ICD-10-CM | POA: Diagnosis not present

## 2017-01-03 DIAGNOSIS — J3081 Allergic rhinitis due to animal (cat) (dog) hair and dander: Secondary | ICD-10-CM | POA: Diagnosis not present

## 2017-01-08 ENCOUNTER — Ambulatory Visit: Payer: BLUE CROSS/BLUE SHIELD | Admitting: Family Medicine

## 2017-01-09 DIAGNOSIS — J3089 Other allergic rhinitis: Secondary | ICD-10-CM | POA: Diagnosis not present

## 2017-01-09 DIAGNOSIS — J3081 Allergic rhinitis due to animal (cat) (dog) hair and dander: Secondary | ICD-10-CM | POA: Diagnosis not present

## 2017-01-09 DIAGNOSIS — J301 Allergic rhinitis due to pollen: Secondary | ICD-10-CM | POA: Diagnosis not present

## 2017-01-23 DIAGNOSIS — J3089 Other allergic rhinitis: Secondary | ICD-10-CM | POA: Diagnosis not present

## 2017-01-23 DIAGNOSIS — J301 Allergic rhinitis due to pollen: Secondary | ICD-10-CM | POA: Diagnosis not present

## 2017-02-03 ENCOUNTER — Other Ambulatory Visit: Payer: Self-pay | Admitting: Family

## 2017-02-07 DIAGNOSIS — J3081 Allergic rhinitis due to animal (cat) (dog) hair and dander: Secondary | ICD-10-CM | POA: Diagnosis not present

## 2017-02-07 DIAGNOSIS — J301 Allergic rhinitis due to pollen: Secondary | ICD-10-CM | POA: Diagnosis not present

## 2017-02-14 DIAGNOSIS — J3089 Other allergic rhinitis: Secondary | ICD-10-CM | POA: Diagnosis not present

## 2017-02-14 DIAGNOSIS — J301 Allergic rhinitis due to pollen: Secondary | ICD-10-CM | POA: Diagnosis not present

## 2017-02-14 DIAGNOSIS — J3081 Allergic rhinitis due to animal (cat) (dog) hair and dander: Secondary | ICD-10-CM | POA: Diagnosis not present

## 2017-02-21 DIAGNOSIS — J3089 Other allergic rhinitis: Secondary | ICD-10-CM | POA: Diagnosis not present

## 2017-02-21 DIAGNOSIS — J301 Allergic rhinitis due to pollen: Secondary | ICD-10-CM | POA: Diagnosis not present

## 2017-02-21 DIAGNOSIS — J3081 Allergic rhinitis due to animal (cat) (dog) hair and dander: Secondary | ICD-10-CM | POA: Diagnosis not present

## 2017-03-07 DIAGNOSIS — J301 Allergic rhinitis due to pollen: Secondary | ICD-10-CM | POA: Diagnosis not present

## 2017-03-07 DIAGNOSIS — J3081 Allergic rhinitis due to animal (cat) (dog) hair and dander: Secondary | ICD-10-CM | POA: Diagnosis not present

## 2017-03-07 DIAGNOSIS — J3089 Other allergic rhinitis: Secondary | ICD-10-CM | POA: Diagnosis not present

## 2017-03-14 DIAGNOSIS — J3081 Allergic rhinitis due to animal (cat) (dog) hair and dander: Secondary | ICD-10-CM | POA: Diagnosis not present

## 2017-03-14 DIAGNOSIS — J301 Allergic rhinitis due to pollen: Secondary | ICD-10-CM | POA: Diagnosis not present

## 2017-03-14 DIAGNOSIS — J3089 Other allergic rhinitis: Secondary | ICD-10-CM | POA: Diagnosis not present

## 2017-03-24 DIAGNOSIS — J3089 Other allergic rhinitis: Secondary | ICD-10-CM | POA: Diagnosis not present

## 2017-03-24 DIAGNOSIS — J301 Allergic rhinitis due to pollen: Secondary | ICD-10-CM | POA: Diagnosis not present

## 2017-03-24 DIAGNOSIS — J3081 Allergic rhinitis due to animal (cat) (dog) hair and dander: Secondary | ICD-10-CM | POA: Diagnosis not present

## 2017-04-09 DIAGNOSIS — J3089 Other allergic rhinitis: Secondary | ICD-10-CM | POA: Diagnosis not present

## 2017-04-09 DIAGNOSIS — J301 Allergic rhinitis due to pollen: Secondary | ICD-10-CM | POA: Diagnosis not present

## 2017-04-09 DIAGNOSIS — J3081 Allergic rhinitis due to animal (cat) (dog) hair and dander: Secondary | ICD-10-CM | POA: Diagnosis not present

## 2017-04-24 DIAGNOSIS — J301 Allergic rhinitis due to pollen: Secondary | ICD-10-CM | POA: Diagnosis not present

## 2017-04-24 DIAGNOSIS — J3089 Other allergic rhinitis: Secondary | ICD-10-CM | POA: Diagnosis not present

## 2017-04-24 DIAGNOSIS — J3081 Allergic rhinitis due to animal (cat) (dog) hair and dander: Secondary | ICD-10-CM | POA: Diagnosis not present

## 2017-05-02 DIAGNOSIS — J301 Allergic rhinitis due to pollen: Secondary | ICD-10-CM | POA: Diagnosis not present

## 2017-05-02 DIAGNOSIS — J3089 Other allergic rhinitis: Secondary | ICD-10-CM | POA: Diagnosis not present

## 2017-05-02 DIAGNOSIS — J3081 Allergic rhinitis due to animal (cat) (dog) hair and dander: Secondary | ICD-10-CM | POA: Diagnosis not present

## 2017-05-06 DIAGNOSIS — J3081 Allergic rhinitis due to animal (cat) (dog) hair and dander: Secondary | ICD-10-CM | POA: Diagnosis not present

## 2017-05-06 DIAGNOSIS — J3089 Other allergic rhinitis: Secondary | ICD-10-CM | POA: Diagnosis not present

## 2017-05-06 DIAGNOSIS — J301 Allergic rhinitis due to pollen: Secondary | ICD-10-CM | POA: Diagnosis not present

## 2017-05-16 DIAGNOSIS — J301 Allergic rhinitis due to pollen: Secondary | ICD-10-CM | POA: Diagnosis not present

## 2017-05-16 DIAGNOSIS — J3089 Other allergic rhinitis: Secondary | ICD-10-CM | POA: Diagnosis not present

## 2017-05-16 DIAGNOSIS — J3081 Allergic rhinitis due to animal (cat) (dog) hair and dander: Secondary | ICD-10-CM | POA: Diagnosis not present

## 2017-05-19 DIAGNOSIS — J301 Allergic rhinitis due to pollen: Secondary | ICD-10-CM | POA: Diagnosis not present

## 2017-05-19 DIAGNOSIS — Z23 Encounter for immunization: Secondary | ICD-10-CM | POA: Diagnosis not present

## 2017-05-19 DIAGNOSIS — J3081 Allergic rhinitis due to animal (cat) (dog) hair and dander: Secondary | ICD-10-CM | POA: Diagnosis not present

## 2017-05-19 DIAGNOSIS — J3089 Other allergic rhinitis: Secondary | ICD-10-CM | POA: Diagnosis not present

## 2017-05-30 DIAGNOSIS — J301 Allergic rhinitis due to pollen: Secondary | ICD-10-CM | POA: Diagnosis not present

## 2017-05-30 DIAGNOSIS — J3081 Allergic rhinitis due to animal (cat) (dog) hair and dander: Secondary | ICD-10-CM | POA: Diagnosis not present

## 2017-06-06 DIAGNOSIS — J301 Allergic rhinitis due to pollen: Secondary | ICD-10-CM | POA: Diagnosis not present

## 2017-06-06 DIAGNOSIS — J3089 Other allergic rhinitis: Secondary | ICD-10-CM | POA: Diagnosis not present

## 2017-06-06 DIAGNOSIS — J3081 Allergic rhinitis due to animal (cat) (dog) hair and dander: Secondary | ICD-10-CM | POA: Diagnosis not present

## 2017-06-11 DIAGNOSIS — J3089 Other allergic rhinitis: Secondary | ICD-10-CM | POA: Diagnosis not present

## 2017-06-11 DIAGNOSIS — J3081 Allergic rhinitis due to animal (cat) (dog) hair and dander: Secondary | ICD-10-CM | POA: Diagnosis not present

## 2017-06-11 DIAGNOSIS — J301 Allergic rhinitis due to pollen: Secondary | ICD-10-CM | POA: Diagnosis not present

## 2017-06-18 DIAGNOSIS — J301 Allergic rhinitis due to pollen: Secondary | ICD-10-CM | POA: Diagnosis not present

## 2017-06-18 DIAGNOSIS — J3089 Other allergic rhinitis: Secondary | ICD-10-CM | POA: Diagnosis not present

## 2017-06-18 DIAGNOSIS — J3081 Allergic rhinitis due to animal (cat) (dog) hair and dander: Secondary | ICD-10-CM | POA: Diagnosis not present

## 2017-06-25 DIAGNOSIS — J301 Allergic rhinitis due to pollen: Secondary | ICD-10-CM | POA: Diagnosis not present

## 2017-06-25 DIAGNOSIS — J3089 Other allergic rhinitis: Secondary | ICD-10-CM | POA: Diagnosis not present

## 2017-06-25 DIAGNOSIS — J3081 Allergic rhinitis due to animal (cat) (dog) hair and dander: Secondary | ICD-10-CM | POA: Diagnosis not present

## 2017-07-02 DIAGNOSIS — J3081 Allergic rhinitis due to animal (cat) (dog) hair and dander: Secondary | ICD-10-CM | POA: Diagnosis not present

## 2017-07-02 DIAGNOSIS — J301 Allergic rhinitis due to pollen: Secondary | ICD-10-CM | POA: Diagnosis not present

## 2017-07-02 DIAGNOSIS — J3089 Other allergic rhinitis: Secondary | ICD-10-CM | POA: Diagnosis not present

## 2017-07-07 DIAGNOSIS — J3089 Other allergic rhinitis: Secondary | ICD-10-CM | POA: Diagnosis not present

## 2017-07-07 DIAGNOSIS — J301 Allergic rhinitis due to pollen: Secondary | ICD-10-CM | POA: Diagnosis not present

## 2017-07-07 DIAGNOSIS — J3081 Allergic rhinitis due to animal (cat) (dog) hair and dander: Secondary | ICD-10-CM | POA: Diagnosis not present

## 2017-07-15 DIAGNOSIS — J301 Allergic rhinitis due to pollen: Secondary | ICD-10-CM | POA: Diagnosis not present

## 2017-07-15 DIAGNOSIS — J3089 Other allergic rhinitis: Secondary | ICD-10-CM | POA: Diagnosis not present

## 2017-07-15 DIAGNOSIS — J3081 Allergic rhinitis due to animal (cat) (dog) hair and dander: Secondary | ICD-10-CM | POA: Diagnosis not present

## 2017-07-24 DIAGNOSIS — J301 Allergic rhinitis due to pollen: Secondary | ICD-10-CM | POA: Diagnosis not present

## 2017-07-24 DIAGNOSIS — J3089 Other allergic rhinitis: Secondary | ICD-10-CM | POA: Diagnosis not present

## 2017-07-24 DIAGNOSIS — J3081 Allergic rhinitis due to animal (cat) (dog) hair and dander: Secondary | ICD-10-CM | POA: Diagnosis not present

## 2017-07-25 DIAGNOSIS — J3089 Other allergic rhinitis: Secondary | ICD-10-CM | POA: Diagnosis not present

## 2017-07-25 DIAGNOSIS — J301 Allergic rhinitis due to pollen: Secondary | ICD-10-CM | POA: Diagnosis not present

## 2017-07-25 DIAGNOSIS — J3081 Allergic rhinitis due to animal (cat) (dog) hair and dander: Secondary | ICD-10-CM | POA: Diagnosis not present

## 2017-08-15 DIAGNOSIS — R05 Cough: Secondary | ICD-10-CM | POA: Diagnosis not present

## 2017-08-15 DIAGNOSIS — J3081 Allergic rhinitis due to animal (cat) (dog) hair and dander: Secondary | ICD-10-CM | POA: Diagnosis not present

## 2017-08-15 DIAGNOSIS — J3089 Other allergic rhinitis: Secondary | ICD-10-CM | POA: Diagnosis not present

## 2017-08-15 DIAGNOSIS — J301 Allergic rhinitis due to pollen: Secondary | ICD-10-CM | POA: Diagnosis not present

## 2017-08-15 DIAGNOSIS — R062 Wheezing: Secondary | ICD-10-CM | POA: Diagnosis not present

## 2017-08-22 ENCOUNTER — Encounter: Payer: Self-pay | Admitting: Family Medicine

## 2017-08-22 DIAGNOSIS — J301 Allergic rhinitis due to pollen: Secondary | ICD-10-CM | POA: Diagnosis not present

## 2017-08-22 DIAGNOSIS — J3089 Other allergic rhinitis: Secondary | ICD-10-CM | POA: Diagnosis not present

## 2017-08-22 DIAGNOSIS — J3081 Allergic rhinitis due to animal (cat) (dog) hair and dander: Secondary | ICD-10-CM | POA: Diagnosis not present

## 2017-08-28 DIAGNOSIS — J301 Allergic rhinitis due to pollen: Secondary | ICD-10-CM | POA: Diagnosis not present

## 2017-08-28 DIAGNOSIS — J3089 Other allergic rhinitis: Secondary | ICD-10-CM | POA: Diagnosis not present

## 2017-08-28 DIAGNOSIS — J3081 Allergic rhinitis due to animal (cat) (dog) hair and dander: Secondary | ICD-10-CM | POA: Diagnosis not present

## 2017-09-11 DIAGNOSIS — J3081 Allergic rhinitis due to animal (cat) (dog) hair and dander: Secondary | ICD-10-CM | POA: Diagnosis not present

## 2017-09-11 DIAGNOSIS — J301 Allergic rhinitis due to pollen: Secondary | ICD-10-CM | POA: Diagnosis not present

## 2017-09-11 DIAGNOSIS — J3089 Other allergic rhinitis: Secondary | ICD-10-CM | POA: Diagnosis not present

## 2017-09-18 DIAGNOSIS — J3081 Allergic rhinitis due to animal (cat) (dog) hair and dander: Secondary | ICD-10-CM | POA: Diagnosis not present

## 2017-09-18 DIAGNOSIS — J3089 Other allergic rhinitis: Secondary | ICD-10-CM | POA: Diagnosis not present

## 2017-09-18 DIAGNOSIS — J301 Allergic rhinitis due to pollen: Secondary | ICD-10-CM | POA: Diagnosis not present

## 2017-09-26 DIAGNOSIS — J3081 Allergic rhinitis due to animal (cat) (dog) hair and dander: Secondary | ICD-10-CM | POA: Diagnosis not present

## 2017-09-26 DIAGNOSIS — J301 Allergic rhinitis due to pollen: Secondary | ICD-10-CM | POA: Diagnosis not present

## 2017-09-26 DIAGNOSIS — J3089 Other allergic rhinitis: Secondary | ICD-10-CM | POA: Diagnosis not present

## 2017-10-03 DIAGNOSIS — J3081 Allergic rhinitis due to animal (cat) (dog) hair and dander: Secondary | ICD-10-CM | POA: Diagnosis not present

## 2017-10-03 DIAGNOSIS — J3089 Other allergic rhinitis: Secondary | ICD-10-CM | POA: Diagnosis not present

## 2017-10-03 DIAGNOSIS — J301 Allergic rhinitis due to pollen: Secondary | ICD-10-CM | POA: Diagnosis not present

## 2017-10-10 DIAGNOSIS — J3081 Allergic rhinitis due to animal (cat) (dog) hair and dander: Secondary | ICD-10-CM | POA: Diagnosis not present

## 2017-10-10 DIAGNOSIS — J3089 Other allergic rhinitis: Secondary | ICD-10-CM | POA: Diagnosis not present

## 2017-10-10 DIAGNOSIS — J301 Allergic rhinitis due to pollen: Secondary | ICD-10-CM | POA: Diagnosis not present

## 2017-10-24 DIAGNOSIS — J3081 Allergic rhinitis due to animal (cat) (dog) hair and dander: Secondary | ICD-10-CM | POA: Diagnosis not present

## 2017-10-24 DIAGNOSIS — J3089 Other allergic rhinitis: Secondary | ICD-10-CM | POA: Diagnosis not present

## 2017-10-24 DIAGNOSIS — J301 Allergic rhinitis due to pollen: Secondary | ICD-10-CM | POA: Diagnosis not present

## 2017-10-31 DIAGNOSIS — J301 Allergic rhinitis due to pollen: Secondary | ICD-10-CM | POA: Diagnosis not present

## 2017-10-31 DIAGNOSIS — J3081 Allergic rhinitis due to animal (cat) (dog) hair and dander: Secondary | ICD-10-CM | POA: Diagnosis not present

## 2017-10-31 DIAGNOSIS — J3089 Other allergic rhinitis: Secondary | ICD-10-CM | POA: Diagnosis not present

## 2017-11-06 DIAGNOSIS — J301 Allergic rhinitis due to pollen: Secondary | ICD-10-CM | POA: Diagnosis not present

## 2017-11-06 DIAGNOSIS — J3081 Allergic rhinitis due to animal (cat) (dog) hair and dander: Secondary | ICD-10-CM | POA: Diagnosis not present

## 2017-11-07 ENCOUNTER — Ambulatory Visit: Payer: BLUE CROSS/BLUE SHIELD | Admitting: Family Medicine

## 2017-11-07 ENCOUNTER — Encounter: Payer: Self-pay | Admitting: Family Medicine

## 2017-11-07 VITALS — BP 104/68 | HR 57 | Temp 98.1°F | Resp 16 | Ht 68.25 in | Wt 145.1 lb

## 2017-11-07 DIAGNOSIS — N3001 Acute cystitis with hematuria: Secondary | ICD-10-CM

## 2017-11-07 DIAGNOSIS — R300 Dysuria: Secondary | ICD-10-CM | POA: Diagnosis not present

## 2017-11-07 LAB — POC URINALSYSI DIPSTICK (AUTOMATED)
BILIRUBIN UA: NEGATIVE
GLUCOSE UA: NEGATIVE
Ketones, UA: NEGATIVE
Leukocytes, UA: NEGATIVE
Nitrite, UA: NEGATIVE
Protein, UA: NEGATIVE
Spec Grav, UA: 1.015 (ref 1.010–1.025)
Urobilinogen, UA: 0.2 E.U./dL
pH, UA: 7 (ref 5.0–8.0)

## 2017-11-07 MED ORDER — SULFAMETHOXAZOLE-TRIMETHOPRIM 800-160 MG PO TABS: 1.0000 | ORAL_TABLET | Freq: Two times a day (BID) | ORAL | 5 refills | Status: DC

## 2017-11-07 NOTE — Progress Notes (Signed)
OFFICE VISIT  11/07/2017   CC:  Chief Complaint  Patient presents with  . Urinary Tract Infection    ?    HPI:    Patient is a 47 y.o. Caucasian male who presents for urinary symptoms. One week hx of urinary frequency, urgency, dysuria.  Wife gave him some AZO.  Most recent dose of AZO was yesterday morning.  Has never had these sx's before. Stream is normal/strong.  No straining.  No gross hematuria.  No abd pain, no flank pain, no nausea.  No fevers. Low risk for GC/Chlam.  No signif discomfort in prostate region.  Past Medical History:  Diagnosis Date  . Allergic rhinitis    Pollen,cat and dog dander, chron allerg conjunctivitis (Dr. Irena CordsVan Winkle)  . Anxiety    PMH of  . Dyspepsia   . Mild intermittent asthma     Past Surgical History:  Procedure Laterality Date  . VASECTOMY      Outpatient Medications Prior to Visit  Medication Sig Dispense Refill  . albuterol (PROVENTIL HFA) 108 (90 Base) MCG/ACT inhaler Inhale 2 puffs into the lungs every 6 (six) hours as needed for wheezing or shortness of breath. 1 Inhaler 1  . fexofenadine (ALLEGRA) 180 MG tablet Take 180 mg by mouth daily.    . fluticasone (FLONASE) 50 MCG/ACT nasal spray Place 1 spray into both nostrils daily. Must see new provider for future refills 16 g 0  . ibuprofen (ADVIL,MOTRIN) 200 MG tablet Take 200 mg by mouth every 6 (six) hours as needed.      Marland Kitchen. EPINEPHrine 0.3 mg/0.3 mL IJ SOAJ injection INJ UTD  1   No facility-administered medications prior to visit.     Allergies  Allergen Reactions  . Zithromax [Azithromycin Dihydrate]     Nausea & vomiting    ROS As per HPI  PE: Blood pressure 104/68, pulse (!) 57, temperature 98.1 F (36.7 C), temperature source Oral, resp. rate 16, height 5' 8.25" (1.734 m), weight 145 lb 2 oz (65.8 kg), SpO2 100 %. Gen: Alert, well appearing.  Patient is oriented to person, place, time, and situation. AFFECT: pleasant, lucid thought and speech. Rectal exam:  negative without mass, lesions or tenderness, PROSTATE EXAM: smooth and symmetric without nodules or tenderness.   LABS:    Chemistry      Component Value Date/Time   NA 141 12/31/2016 0844   K 4.7 12/31/2016 0844   CL 104 12/31/2016 0844   CO2 31 12/31/2016 0844   BUN 15 12/31/2016 0844   CREATININE 0.93 12/31/2016 0844      Component Value Date/Time   CALCIUM 9.3 12/31/2016 0844   ALKPHOS 70 12/31/2016 0844   AST 16 12/31/2016 0844   ALT 14 12/31/2016 0844   BILITOT 0.8 12/31/2016 0844     CC UA today: 2+ blood, o/w normal.  IMPRESSION AND PLAN:  Acute cystitis with microhematuria. Prostate exam unremarkable. Send urine for c/s. Start bactrim DS, 1 bid x 5 d.  An After Visit Summary was printed and given to the patient.  FOLLOW UP: Return if symptoms worsen or fail to improve.  Signed:  Santiago BumpersPhil McGowen, MD           11/07/2017

## 2017-11-08 LAB — URINE CULTURE
MICRO NUMBER: 90396570
RESULT: NO GROWTH
SPECIMEN QUALITY:: ADEQUATE

## 2017-11-13 DIAGNOSIS — J3089 Other allergic rhinitis: Secondary | ICD-10-CM | POA: Diagnosis not present

## 2017-11-13 DIAGNOSIS — J301 Allergic rhinitis due to pollen: Secondary | ICD-10-CM | POA: Diagnosis not present

## 2017-11-21 DIAGNOSIS — J3081 Allergic rhinitis due to animal (cat) (dog) hair and dander: Secondary | ICD-10-CM | POA: Diagnosis not present

## 2017-11-21 DIAGNOSIS — J3089 Other allergic rhinitis: Secondary | ICD-10-CM | POA: Diagnosis not present

## 2017-11-27 DIAGNOSIS — J3081 Allergic rhinitis due to animal (cat) (dog) hair and dander: Secondary | ICD-10-CM | POA: Diagnosis not present

## 2017-11-27 DIAGNOSIS — J301 Allergic rhinitis due to pollen: Secondary | ICD-10-CM | POA: Diagnosis not present

## 2017-11-27 DIAGNOSIS — J3089 Other allergic rhinitis: Secondary | ICD-10-CM | POA: Diagnosis not present

## 2017-12-05 DIAGNOSIS — J3081 Allergic rhinitis due to animal (cat) (dog) hair and dander: Secondary | ICD-10-CM | POA: Diagnosis not present

## 2017-12-05 DIAGNOSIS — J301 Allergic rhinitis due to pollen: Secondary | ICD-10-CM | POA: Diagnosis not present

## 2017-12-05 DIAGNOSIS — J3089 Other allergic rhinitis: Secondary | ICD-10-CM | POA: Diagnosis not present

## 2017-12-10 DIAGNOSIS — N138 Other obstructive and reflux uropathy: Secondary | ICD-10-CM

## 2017-12-10 DIAGNOSIS — N401 Enlarged prostate with lower urinary tract symptoms: Secondary | ICD-10-CM

## 2017-12-10 HISTORY — DX: Other obstructive and reflux uropathy: N40.1

## 2017-12-10 HISTORY — DX: Other obstructive and reflux uropathy: N13.8

## 2017-12-12 DIAGNOSIS — J301 Allergic rhinitis due to pollen: Secondary | ICD-10-CM | POA: Diagnosis not present

## 2017-12-12 DIAGNOSIS — J3081 Allergic rhinitis due to animal (cat) (dog) hair and dander: Secondary | ICD-10-CM | POA: Diagnosis not present

## 2017-12-12 DIAGNOSIS — J3089 Other allergic rhinitis: Secondary | ICD-10-CM | POA: Diagnosis not present

## 2017-12-19 DIAGNOSIS — J3081 Allergic rhinitis due to animal (cat) (dog) hair and dander: Secondary | ICD-10-CM | POA: Diagnosis not present

## 2017-12-19 DIAGNOSIS — J301 Allergic rhinitis due to pollen: Secondary | ICD-10-CM | POA: Diagnosis not present

## 2017-12-19 DIAGNOSIS — J3089 Other allergic rhinitis: Secondary | ICD-10-CM | POA: Diagnosis not present

## 2017-12-25 DIAGNOSIS — J301 Allergic rhinitis due to pollen: Secondary | ICD-10-CM | POA: Diagnosis not present

## 2017-12-25 DIAGNOSIS — J3089 Other allergic rhinitis: Secondary | ICD-10-CM | POA: Diagnosis not present

## 2017-12-25 DIAGNOSIS — J3081 Allergic rhinitis due to animal (cat) (dog) hair and dander: Secondary | ICD-10-CM | POA: Diagnosis not present

## 2017-12-29 ENCOUNTER — Encounter: Payer: BLUE CROSS/BLUE SHIELD | Admitting: Family Medicine

## 2018-01-01 ENCOUNTER — Encounter: Payer: Self-pay | Admitting: Family Medicine

## 2018-01-01 ENCOUNTER — Ambulatory Visit (INDEPENDENT_AMBULATORY_CARE_PROVIDER_SITE_OTHER): Payer: BLUE CROSS/BLUE SHIELD | Admitting: Family Medicine

## 2018-01-01 VITALS — BP 98/73 | HR 61 | Temp 98.1°F | Resp 16 | Ht 68.5 in | Wt 144.5 lb

## 2018-01-01 DIAGNOSIS — Z0000 Encounter for general adult medical examination without abnormal findings: Secondary | ICD-10-CM | POA: Diagnosis not present

## 2018-01-01 LAB — LIPID PANEL
CHOLESTEROL: 157 mg/dL (ref 0–200)
HDL: 61.5 mg/dL (ref >39.00)
LDL Cholesterol: 87 mg/dL (ref 0–99)
NonHDL: 95.28
TRIGLYCERIDES: 42 mg/dL (ref 0.0–149.0)
Total CHOL/HDL Ratio: 3
VLDL: 8.4 mg/dL (ref 0.0–40.0)

## 2018-01-01 LAB — CBC WITH DIFFERENTIAL/PLATELET
Basophils Absolute: 0 10*3/uL (ref 0.0–0.1)
Basophils Relative: 1 % (ref 0.0–3.0)
Eosinophils Absolute: 0.1 10*3/uL (ref 0.0–0.7)
Eosinophils Relative: 1.6 % (ref 0.0–5.0)
HCT: 44.1 % (ref 39.0–52.0)
Hemoglobin: 14.7 g/dL (ref 13.0–17.0)
LYMPHS ABS: 1.1 10*3/uL (ref 0.7–4.0)
Lymphocytes Relative: 24.1 % (ref 12.0–46.0)
MCHC: 33.3 g/dL (ref 30.0–36.0)
MCV: 86.5 fl (ref 78.0–100.0)
MONO ABS: 0.5 10*3/uL (ref 0.1–1.0)
Monocytes Relative: 9.9 % (ref 3.0–12.0)
NEUTROS ABS: 3 10*3/uL (ref 1.4–7.7)
NEUTROS PCT: 63.4 % (ref 43.0–77.0)
PLATELETS: 190 10*3/uL (ref 150.0–400.0)
RBC: 5.1 Mil/uL (ref 4.22–5.81)
RDW: 13.3 % (ref 11.5–15.5)
WBC: 4.7 10*3/uL (ref 4.0–10.5)

## 2018-01-01 LAB — COMPREHENSIVE METABOLIC PANEL
ALK PHOS: 67 U/L (ref 39–117)
ALT: 17 U/L (ref 0–53)
AST: 21 U/L (ref 0–37)
Albumin: 4.6 g/dL (ref 3.5–5.2)
BILIRUBIN TOTAL: 0.6 mg/dL (ref 0.2–1.2)
BUN: 27 mg/dL — ABNORMAL HIGH (ref 6–23)
CO2: 30 mEq/L (ref 19–32)
Calcium: 9.6 mg/dL (ref 8.4–10.5)
Chloride: 105 mEq/L (ref 96–112)
Creatinine, Ser: 1.13 mg/dL (ref 0.40–1.50)
GFR: 74.47 mL/min (ref >60.00)
Glucose, Bld: 96 mg/dL (ref 70–99)
POTASSIUM: 5.2 meq/L — AB (ref 3.5–5.1)
SODIUM: 140 meq/L (ref 135–145)
TOTAL PROTEIN: 6.5 g/dL (ref 6.0–8.3)

## 2018-01-01 LAB — TSH: TSH: 1.53 u[IU]/mL (ref 0.35–4.50)

## 2018-01-01 NOTE — Progress Notes (Signed)
Office Note 01/01/2018  CC:  Chief Complaint  Patient presents with  . Annual Exam    Pt is fasting.     HPI:  Travis Reeves is a 47 y.o. White male who is here for annual health maintenance exam.  Exercise: runs a lot. Diet: Needs to eat more fruits/veggies.  Limits red meat.   Eyes: exam UTD. Dental: preventatives UTD.    Past Medical History:  Diagnosis Date  . Allergic rhinitis    Pollen,cat and dog dander, chron allerg conjunctivitis (Dr. Zenaida Niece Winkle)--gets allergy immunotherapy injections.  . Anxiety    PMH of  . Dyspepsia   . Mild intermittent asthma     Past Surgical History:  Procedure Laterality Date  . VASECTOMY      Family History  Problem Relation Age of Onset  . Hypertension Mother   . Hyperlipidemia Mother   . Diabetes Mother   . Migraines Mother   . Pancreatic cancer Mother        died 31  . Depression Father   . Arthritis Father   . Hypertension Father   . Hyperlipidemia Father   . Heart attack Father        > 55; S/P 2 stents  . Diabetes Father   . Colon polyps Father   . Cancer Paternal Grandmother        colon & stomach  . Transient ischemic attack Paternal Grandfather   . Breast cancer Paternal Aunt   . Allergy (severe) Son        on shots    Social History   Socioeconomic History  . Marital status: Married    Spouse name: Not on file  . Number of children: 2  . Years of education: 25  . Highest education level: Not on file  Occupational History  . Occupation: Airline pilot Rep  Social Needs  . Financial resource strain: Not on file  . Food insecurity:    Worry: Not on file    Inability: Not on file  . Transportation needs:    Medical: Not on file    Non-medical: Not on file  Tobacco Use  . Smoking status: Never Smoker  . Smokeless tobacco: Never Used  Substance and Sexual Activity  . Alcohol use: Yes    Comment:  occasionally  . Drug use: No  . Sexual activity: Not on file  Lifestyle  . Physical activity:   Days per week: Not on file    Minutes per session: Not on file  . Stress: Not on file  Relationships  . Social connections:    Talks on phone: Not on file    Gets together: Not on file    Attends religious service: Not on file    Active member of club or organization: Not on file    Attends meetings of clubs or organizations: Not on file    Relationship status: Not on file  . Intimate partner violence:    Fear of current or ex partner: Not on file    Emotionally abused: Not on file    Physically abused: Not on file    Forced sexual activity: Not on file  Other Topics Concern  . Not on file  Social History Narrative   Married, 2 boys.   Educ: Bachelors in agricultural business.   Occup: Scientist, water quality.   No tob, some weekend alcohol.  No drugs.   Fun: Golf and exercise (running).    Outpatient Medications Prior to Visit  Medication Sig  Dispense Refill  . albuterol (PROVENTIL HFA) 108 (90 Base) MCG/ACT inhaler Inhale 2 puffs into the lungs every 6 (six) hours as needed for wheezing or shortness of breath. 1 Inhaler 1  . EPINEPHrine 0.3 mg/0.3 mL IJ SOAJ injection INJ UTD  1  . fexofenadine (ALLEGRA) 180 MG tablet Take 180 mg by mouth daily.    . fluticasone (FLONASE) 50 MCG/ACT nasal spray Place 1 spray into both nostrils daily. Must see new provider for future refills 16 g 0  . ibuprofen (ADVIL,MOTRIN) 200 MG tablet Take 200 mg by mouth every 6 (six) hours as needed.      . sulfamethoxazole-trimethoprim (BACTRIM DS,SEPTRA DS) 800-160 MG tablet Take 1 tablet by mouth 2 (two) times daily. (Patient not taking: Reported on 01/01/2018) 10 tablet 5   No facility-administered medications prior to visit.     Allergies  Allergen Reactions  . Zithromax [Azithromycin Dihydrate]     Nausea & vomiting    ROS Review of Systems  Constitutional: Negative for appetite change, chills, fatigue and fever.  HENT: Negative for congestion, dental problem, ear pain and sore throat.   Eyes:  Negative for discharge, redness and visual disturbance.  Respiratory: Negative for cough, chest tightness, shortness of breath and wheezing.   Cardiovascular: Negative for chest pain, palpitations and leg swelling.  Gastrointestinal: Negative for abdominal pain, blood in stool, diarrhea, nausea and vomiting.  Genitourinary: Negative for difficulty urinating, dysuria, flank pain, frequency, hematuria and urgency.  Musculoskeletal: Negative for arthralgias, back pain, joint swelling, myalgias and neck stiffness.  Skin: Negative for pallor and rash.  Neurological: Negative for dizziness, speech difficulty, weakness and headaches.  Hematological: Negative for adenopathy. Does not bruise/bleed easily.  Psychiatric/Behavioral: Negative for confusion and sleep disturbance. The patient is not nervous/anxious.     PE; Blood pressure 98/73, pulse 61, temperature 98.1 F (36.7 C), temperature source Oral, resp. rate 16, height 5' 8.5" (1.74 m), weight 144 lb 8 oz (65.5 kg), SpO2 97 %. Body mass index is 21.65 kg/m.  Gen: Alert, well appearing.  Patient is oriented to person, place, time, and situation. AFFECT: pleasant, lucid thought and speech. ENT: Ears: EACs clear, normal epithelium.  TMs with good light reflex and landmarks bilaterally.  Eyes: no injection, icteris, swelling, or exudate.  EOMI, PERRLA. Nose: no drainage or turbinate edema/swelling.  No injection or focal lesion.  Mouth: lips without lesion/swelling.  Oral mucosa pink and moist.  Dentition intact and without obvious caries or gingival swelling.  Oropharynx without erythema, exudate, or swelling.  Neck: supple/nontender.  No LAD, mass, or TM.  Carotid pulses 2+ bilaterally, without bruits. CV: RRR, no m/r/g.   LUNGS: CTA bilat, nonlabored resps, good aeration in all lung fields. ABD: soft, NT, ND, BS normal.  No hepatospenomegaly or mass.  No bruits.  Abdominal aortic pulsations palpable in epigastrum. EXT: no clubbing, cyanosis, or  edema.  Musculoskeletal: no joint swelling, erythema, warmth, or tenderness.  ROM of all joints intact. Skin - no sores or suspicious lesions or rashes or color changes   Pertinent labs:  Lab Results  Component Value Date   TSH 1.24 12/31/2016   Lab Results  Component Value Date   WBC 9.5 12/31/2016   HGB 14.7 12/31/2016   HCT 44.0 12/31/2016   MCV 86.6 12/31/2016   PLT 260.0 12/31/2016   Lab Results  Component Value Date   CREATININE 0.93 12/31/2016   BUN 15 12/31/2016   NA 141 12/31/2016   K 4.7 12/31/2016  CL 104 12/31/2016   CO2 31 12/31/2016   Lab Results  Component Value Date   ALT 14 12/31/2016   AST 16 12/31/2016   ALKPHOS 70 12/31/2016   BILITOT 0.8 12/31/2016   Lab Results  Component Value Date   CHOL 139 12/31/2016   Lab Results  Component Value Date   HDL 49.00 12/31/2016   Lab Results  Component Value Date   LDLCALC 81 12/31/2016   Lab Results  Component Value Date   TRIG 45.0 12/31/2016   Lab Results  Component Value Date   CHOLHDL 3 12/31/2016    ASSESSMENT AND PLAN:   Health maintenance exam: Reviewed age and gender appropriate health maintenance issues (prudent diet, regular exercise, health risks of tobacco and excessive alcohol, use of seatbelts, fire alarms in home, use of sunscreen).  Also reviewed age and gender appropriate health screening as well as vaccine recommendations. Vaccines: UTD. Labs: fasting HP.  Pt declined HIV screening. Prostate ca screening: average risk patient= start screening at age 45 yrs. Colon ca screening: average risk patient= start screening at age 13 yrs.   An After Visit Summary was printed and given to the patient.  FOLLOW UP:  Return in about 1 year (around 01/02/2019) for annual CPE (fasting).  Signed:  Santiago Bumpers, MD           01/01/2018

## 2018-01-01 NOTE — Patient Instructions (Signed)

## 2018-01-06 ENCOUNTER — Other Ambulatory Visit: Payer: Self-pay | Admitting: *Deleted

## 2018-01-06 DIAGNOSIS — E875 Hyperkalemia: Secondary | ICD-10-CM

## 2018-01-08 DIAGNOSIS — J301 Allergic rhinitis due to pollen: Secondary | ICD-10-CM | POA: Diagnosis not present

## 2018-01-08 DIAGNOSIS — R300 Dysuria: Secondary | ICD-10-CM | POA: Diagnosis not present

## 2018-01-08 DIAGNOSIS — J3089 Other allergic rhinitis: Secondary | ICD-10-CM | POA: Diagnosis not present

## 2018-01-08 DIAGNOSIS — R3912 Poor urinary stream: Secondary | ICD-10-CM | POA: Diagnosis not present

## 2018-01-08 DIAGNOSIS — N401 Enlarged prostate with lower urinary tract symptoms: Secondary | ICD-10-CM | POA: Diagnosis not present

## 2018-01-08 DIAGNOSIS — J3081 Allergic rhinitis due to animal (cat) (dog) hair and dander: Secondary | ICD-10-CM | POA: Diagnosis not present

## 2018-01-15 ENCOUNTER — Other Ambulatory Visit (INDEPENDENT_AMBULATORY_CARE_PROVIDER_SITE_OTHER): Payer: BLUE CROSS/BLUE SHIELD

## 2018-01-15 ENCOUNTER — Encounter: Payer: Self-pay | Admitting: Family Medicine

## 2018-01-15 DIAGNOSIS — E875 Hyperkalemia: Secondary | ICD-10-CM

## 2018-01-15 LAB — BASIC METABOLIC PANEL
BUN: 25 mg/dL — ABNORMAL HIGH (ref 6–23)
CALCIUM: 9.3 mg/dL (ref 8.4–10.5)
CO2: 29 meq/L (ref 19–32)
Chloride: 105 mEq/L (ref 96–112)
Creatinine, Ser: 1.11 mg/dL (ref 0.40–1.50)
GFR: 76.01 mL/min (ref >60.00)
Glucose, Bld: 91 mg/dL (ref 70–99)
Potassium: 4.8 mEq/L (ref 3.5–5.1)
SODIUM: 140 meq/L (ref 135–145)

## 2018-01-16 DIAGNOSIS — J3081 Allergic rhinitis due to animal (cat) (dog) hair and dander: Secondary | ICD-10-CM | POA: Diagnosis not present

## 2018-01-16 DIAGNOSIS — J3089 Other allergic rhinitis: Secondary | ICD-10-CM | POA: Diagnosis not present

## 2018-01-22 DIAGNOSIS — J3089 Other allergic rhinitis: Secondary | ICD-10-CM | POA: Diagnosis not present

## 2018-01-22 DIAGNOSIS — J301 Allergic rhinitis due to pollen: Secondary | ICD-10-CM | POA: Diagnosis not present

## 2018-01-22 DIAGNOSIS — J3081 Allergic rhinitis due to animal (cat) (dog) hair and dander: Secondary | ICD-10-CM | POA: Diagnosis not present

## 2018-01-30 DIAGNOSIS — J3081 Allergic rhinitis due to animal (cat) (dog) hair and dander: Secondary | ICD-10-CM | POA: Diagnosis not present

## 2018-01-30 DIAGNOSIS — J301 Allergic rhinitis due to pollen: Secondary | ICD-10-CM | POA: Diagnosis not present

## 2018-01-30 DIAGNOSIS — J3089 Other allergic rhinitis: Secondary | ICD-10-CM | POA: Diagnosis not present

## 2018-02-11 DIAGNOSIS — J3089 Other allergic rhinitis: Secondary | ICD-10-CM | POA: Diagnosis not present

## 2018-02-11 DIAGNOSIS — J301 Allergic rhinitis due to pollen: Secondary | ICD-10-CM | POA: Diagnosis not present

## 2018-02-11 DIAGNOSIS — J3081 Allergic rhinitis due to animal (cat) (dog) hair and dander: Secondary | ICD-10-CM | POA: Diagnosis not present

## 2018-02-19 DIAGNOSIS — J301 Allergic rhinitis due to pollen: Secondary | ICD-10-CM | POA: Diagnosis not present

## 2018-02-19 DIAGNOSIS — J3089 Other allergic rhinitis: Secondary | ICD-10-CM | POA: Diagnosis not present

## 2018-02-19 DIAGNOSIS — J3081 Allergic rhinitis due to animal (cat) (dog) hair and dander: Secondary | ICD-10-CM | POA: Diagnosis not present

## 2018-02-27 DIAGNOSIS — J3081 Allergic rhinitis due to animal (cat) (dog) hair and dander: Secondary | ICD-10-CM | POA: Diagnosis not present

## 2018-02-27 DIAGNOSIS — J301 Allergic rhinitis due to pollen: Secondary | ICD-10-CM | POA: Diagnosis not present

## 2018-02-27 DIAGNOSIS — J3089 Other allergic rhinitis: Secondary | ICD-10-CM | POA: Diagnosis not present

## 2018-03-06 DIAGNOSIS — J3089 Other allergic rhinitis: Secondary | ICD-10-CM | POA: Diagnosis not present

## 2018-03-06 DIAGNOSIS — J301 Allergic rhinitis due to pollen: Secondary | ICD-10-CM | POA: Diagnosis not present

## 2018-03-06 DIAGNOSIS — J3081 Allergic rhinitis due to animal (cat) (dog) hair and dander: Secondary | ICD-10-CM | POA: Diagnosis not present

## 2018-03-13 DIAGNOSIS — J3089 Other allergic rhinitis: Secondary | ICD-10-CM | POA: Diagnosis not present

## 2018-03-13 DIAGNOSIS — J3081 Allergic rhinitis due to animal (cat) (dog) hair and dander: Secondary | ICD-10-CM | POA: Diagnosis not present

## 2018-03-13 DIAGNOSIS — J301 Allergic rhinitis due to pollen: Secondary | ICD-10-CM | POA: Diagnosis not present

## 2018-03-27 DIAGNOSIS — J301 Allergic rhinitis due to pollen: Secondary | ICD-10-CM | POA: Diagnosis not present

## 2018-03-27 DIAGNOSIS — R05 Cough: Secondary | ICD-10-CM | POA: Diagnosis not present

## 2018-03-27 DIAGNOSIS — R062 Wheezing: Secondary | ICD-10-CM | POA: Diagnosis not present

## 2018-03-27 DIAGNOSIS — J3081 Allergic rhinitis due to animal (cat) (dog) hair and dander: Secondary | ICD-10-CM | POA: Diagnosis not present

## 2018-03-27 DIAGNOSIS — J3089 Other allergic rhinitis: Secondary | ICD-10-CM | POA: Diagnosis not present

## 2018-04-03 DIAGNOSIS — J3089 Other allergic rhinitis: Secondary | ICD-10-CM | POA: Diagnosis not present

## 2018-04-03 DIAGNOSIS — J301 Allergic rhinitis due to pollen: Secondary | ICD-10-CM | POA: Diagnosis not present

## 2018-04-03 DIAGNOSIS — J3081 Allergic rhinitis due to animal (cat) (dog) hair and dander: Secondary | ICD-10-CM | POA: Diagnosis not present

## 2018-04-10 DIAGNOSIS — J301 Allergic rhinitis due to pollen: Secondary | ICD-10-CM | POA: Diagnosis not present

## 2018-04-10 DIAGNOSIS — J3089 Other allergic rhinitis: Secondary | ICD-10-CM | POA: Diagnosis not present

## 2018-04-10 DIAGNOSIS — J3081 Allergic rhinitis due to animal (cat) (dog) hair and dander: Secondary | ICD-10-CM | POA: Diagnosis not present

## 2018-04-12 DIAGNOSIS — S9002XA Contusion of left ankle, initial encounter: Secondary | ICD-10-CM

## 2018-04-12 HISTORY — DX: Contusion of left ankle, initial encounter: S90.02XA

## 2018-04-14 DIAGNOSIS — J3081 Allergic rhinitis due to animal (cat) (dog) hair and dander: Secondary | ICD-10-CM | POA: Diagnosis not present

## 2018-04-14 DIAGNOSIS — J3089 Other allergic rhinitis: Secondary | ICD-10-CM | POA: Diagnosis not present

## 2018-04-14 DIAGNOSIS — J301 Allergic rhinitis due to pollen: Secondary | ICD-10-CM | POA: Diagnosis not present

## 2018-04-23 DIAGNOSIS — N401 Enlarged prostate with lower urinary tract symptoms: Secondary | ICD-10-CM | POA: Diagnosis not present

## 2018-04-23 DIAGNOSIS — R3912 Poor urinary stream: Secondary | ICD-10-CM | POA: Diagnosis not present

## 2018-04-23 DIAGNOSIS — R300 Dysuria: Secondary | ICD-10-CM | POA: Diagnosis not present

## 2018-04-24 DIAGNOSIS — J3081 Allergic rhinitis due to animal (cat) (dog) hair and dander: Secondary | ICD-10-CM | POA: Diagnosis not present

## 2018-04-24 DIAGNOSIS — J3089 Other allergic rhinitis: Secondary | ICD-10-CM | POA: Diagnosis not present

## 2018-05-08 DIAGNOSIS — J3081 Allergic rhinitis due to animal (cat) (dog) hair and dander: Secondary | ICD-10-CM | POA: Diagnosis not present

## 2018-05-08 DIAGNOSIS — J3089 Other allergic rhinitis: Secondary | ICD-10-CM | POA: Diagnosis not present

## 2018-05-08 DIAGNOSIS — J301 Allergic rhinitis due to pollen: Secondary | ICD-10-CM | POA: Diagnosis not present

## 2018-05-13 DIAGNOSIS — J301 Allergic rhinitis due to pollen: Secondary | ICD-10-CM | POA: Diagnosis not present

## 2018-05-13 DIAGNOSIS — J3081 Allergic rhinitis due to animal (cat) (dog) hair and dander: Secondary | ICD-10-CM | POA: Diagnosis not present

## 2018-05-13 DIAGNOSIS — J3089 Other allergic rhinitis: Secondary | ICD-10-CM | POA: Diagnosis not present

## 2018-05-14 DIAGNOSIS — M25572 Pain in left ankle and joints of left foot: Secondary | ICD-10-CM | POA: Diagnosis not present

## 2018-05-22 DIAGNOSIS — J3089 Other allergic rhinitis: Secondary | ICD-10-CM | POA: Diagnosis not present

## 2018-05-22 DIAGNOSIS — J3081 Allergic rhinitis due to animal (cat) (dog) hair and dander: Secondary | ICD-10-CM | POA: Diagnosis not present

## 2018-05-29 DIAGNOSIS — J3089 Other allergic rhinitis: Secondary | ICD-10-CM | POA: Diagnosis not present

## 2018-05-29 DIAGNOSIS — J3081 Allergic rhinitis due to animal (cat) (dog) hair and dander: Secondary | ICD-10-CM | POA: Diagnosis not present

## 2018-05-29 DIAGNOSIS — J301 Allergic rhinitis due to pollen: Secondary | ICD-10-CM | POA: Diagnosis not present

## 2018-06-01 ENCOUNTER — Encounter: Payer: Self-pay | Admitting: Family Medicine

## 2018-06-05 DIAGNOSIS — J301 Allergic rhinitis due to pollen: Secondary | ICD-10-CM | POA: Diagnosis not present

## 2018-06-05 DIAGNOSIS — J3081 Allergic rhinitis due to animal (cat) (dog) hair and dander: Secondary | ICD-10-CM | POA: Diagnosis not present

## 2018-06-05 DIAGNOSIS — J3089 Other allergic rhinitis: Secondary | ICD-10-CM | POA: Diagnosis not present

## 2018-06-12 DIAGNOSIS — J301 Allergic rhinitis due to pollen: Secondary | ICD-10-CM | POA: Diagnosis not present

## 2018-06-12 DIAGNOSIS — J3081 Allergic rhinitis due to animal (cat) (dog) hair and dander: Secondary | ICD-10-CM | POA: Diagnosis not present

## 2018-06-12 DIAGNOSIS — J3089 Other allergic rhinitis: Secondary | ICD-10-CM | POA: Diagnosis not present

## 2018-06-25 DIAGNOSIS — J3081 Allergic rhinitis due to animal (cat) (dog) hair and dander: Secondary | ICD-10-CM | POA: Diagnosis not present

## 2018-06-25 DIAGNOSIS — J301 Allergic rhinitis due to pollen: Secondary | ICD-10-CM | POA: Diagnosis not present

## 2018-06-25 DIAGNOSIS — J3089 Other allergic rhinitis: Secondary | ICD-10-CM | POA: Diagnosis not present

## 2018-07-07 DIAGNOSIS — J3089 Other allergic rhinitis: Secondary | ICD-10-CM | POA: Diagnosis not present

## 2018-07-07 DIAGNOSIS — J3081 Allergic rhinitis due to animal (cat) (dog) hair and dander: Secondary | ICD-10-CM | POA: Diagnosis not present

## 2018-07-07 DIAGNOSIS — J301 Allergic rhinitis due to pollen: Secondary | ICD-10-CM | POA: Diagnosis not present

## 2018-07-17 DIAGNOSIS — J301 Allergic rhinitis due to pollen: Secondary | ICD-10-CM | POA: Diagnosis not present

## 2018-07-17 DIAGNOSIS — J3081 Allergic rhinitis due to animal (cat) (dog) hair and dander: Secondary | ICD-10-CM | POA: Diagnosis not present

## 2018-07-17 DIAGNOSIS — J3089 Other allergic rhinitis: Secondary | ICD-10-CM | POA: Diagnosis not present

## 2018-07-31 DIAGNOSIS — J3089 Other allergic rhinitis: Secondary | ICD-10-CM | POA: Diagnosis not present

## 2018-07-31 DIAGNOSIS — J3081 Allergic rhinitis due to animal (cat) (dog) hair and dander: Secondary | ICD-10-CM | POA: Diagnosis not present

## 2018-07-31 DIAGNOSIS — J301 Allergic rhinitis due to pollen: Secondary | ICD-10-CM | POA: Diagnosis not present

## 2018-08-14 DIAGNOSIS — J3089 Other allergic rhinitis: Secondary | ICD-10-CM | POA: Diagnosis not present

## 2018-08-14 DIAGNOSIS — J3081 Allergic rhinitis due to animal (cat) (dog) hair and dander: Secondary | ICD-10-CM | POA: Diagnosis not present

## 2018-08-14 DIAGNOSIS — J301 Allergic rhinitis due to pollen: Secondary | ICD-10-CM | POA: Diagnosis not present

## 2018-08-21 DIAGNOSIS — J3081 Allergic rhinitis due to animal (cat) (dog) hair and dander: Secondary | ICD-10-CM | POA: Diagnosis not present

## 2018-08-21 DIAGNOSIS — J301 Allergic rhinitis due to pollen: Secondary | ICD-10-CM | POA: Diagnosis not present

## 2018-08-21 DIAGNOSIS — J3089 Other allergic rhinitis: Secondary | ICD-10-CM | POA: Diagnosis not present

## 2018-08-25 DIAGNOSIS — J3081 Allergic rhinitis due to animal (cat) (dog) hair and dander: Secondary | ICD-10-CM | POA: Diagnosis not present

## 2018-08-25 DIAGNOSIS — J3089 Other allergic rhinitis: Secondary | ICD-10-CM | POA: Diagnosis not present

## 2018-08-25 DIAGNOSIS — J301 Allergic rhinitis due to pollen: Secondary | ICD-10-CM | POA: Diagnosis not present

## 2018-09-16 DIAGNOSIS — S81801A Unspecified open wound, right lower leg, initial encounter: Secondary | ICD-10-CM | POA: Diagnosis not present

## 2018-09-18 DIAGNOSIS — J301 Allergic rhinitis due to pollen: Secondary | ICD-10-CM | POA: Diagnosis not present

## 2018-09-18 DIAGNOSIS — J3089 Other allergic rhinitis: Secondary | ICD-10-CM | POA: Diagnosis not present

## 2018-09-18 DIAGNOSIS — J3081 Allergic rhinitis due to animal (cat) (dog) hair and dander: Secondary | ICD-10-CM | POA: Diagnosis not present

## 2018-09-22 DIAGNOSIS — J3081 Allergic rhinitis due to animal (cat) (dog) hair and dander: Secondary | ICD-10-CM | POA: Diagnosis not present

## 2018-09-22 DIAGNOSIS — J301 Allergic rhinitis due to pollen: Secondary | ICD-10-CM | POA: Diagnosis not present

## 2018-09-22 DIAGNOSIS — J3089 Other allergic rhinitis: Secondary | ICD-10-CM | POA: Diagnosis not present

## 2018-09-25 DIAGNOSIS — J3081 Allergic rhinitis due to animal (cat) (dog) hair and dander: Secondary | ICD-10-CM | POA: Diagnosis not present

## 2018-09-25 DIAGNOSIS — J3089 Other allergic rhinitis: Secondary | ICD-10-CM | POA: Diagnosis not present

## 2018-10-09 DIAGNOSIS — J301 Allergic rhinitis due to pollen: Secondary | ICD-10-CM | POA: Diagnosis not present

## 2018-10-09 DIAGNOSIS — J3089 Other allergic rhinitis: Secondary | ICD-10-CM | POA: Diagnosis not present

## 2018-10-09 DIAGNOSIS — J3081 Allergic rhinitis due to animal (cat) (dog) hair and dander: Secondary | ICD-10-CM | POA: Diagnosis not present

## 2018-10-15 DIAGNOSIS — J301 Allergic rhinitis due to pollen: Secondary | ICD-10-CM | POA: Diagnosis not present

## 2018-10-15 DIAGNOSIS — J3081 Allergic rhinitis due to animal (cat) (dog) hair and dander: Secondary | ICD-10-CM | POA: Diagnosis not present

## 2018-10-15 DIAGNOSIS — J3089 Other allergic rhinitis: Secondary | ICD-10-CM | POA: Diagnosis not present

## 2018-10-23 DIAGNOSIS — J3081 Allergic rhinitis due to animal (cat) (dog) hair and dander: Secondary | ICD-10-CM | POA: Diagnosis not present

## 2018-10-23 DIAGNOSIS — J301 Allergic rhinitis due to pollen: Secondary | ICD-10-CM | POA: Diagnosis not present

## 2018-10-23 DIAGNOSIS — J3089 Other allergic rhinitis: Secondary | ICD-10-CM | POA: Diagnosis not present

## 2018-10-29 DIAGNOSIS — J301 Allergic rhinitis due to pollen: Secondary | ICD-10-CM | POA: Diagnosis not present

## 2018-10-29 DIAGNOSIS — J3089 Other allergic rhinitis: Secondary | ICD-10-CM | POA: Diagnosis not present

## 2018-10-29 DIAGNOSIS — J3081 Allergic rhinitis due to animal (cat) (dog) hair and dander: Secondary | ICD-10-CM | POA: Diagnosis not present

## 2018-11-06 DIAGNOSIS — J3081 Allergic rhinitis due to animal (cat) (dog) hair and dander: Secondary | ICD-10-CM | POA: Diagnosis not present

## 2018-11-06 DIAGNOSIS — J3089 Other allergic rhinitis: Secondary | ICD-10-CM | POA: Diagnosis not present

## 2018-11-06 DIAGNOSIS — J301 Allergic rhinitis due to pollen: Secondary | ICD-10-CM | POA: Diagnosis not present

## 2018-11-13 DIAGNOSIS — J301 Allergic rhinitis due to pollen: Secondary | ICD-10-CM | POA: Diagnosis not present

## 2018-11-13 DIAGNOSIS — J3089 Other allergic rhinitis: Secondary | ICD-10-CM | POA: Diagnosis not present

## 2018-11-13 DIAGNOSIS — J3081 Allergic rhinitis due to animal (cat) (dog) hair and dander: Secondary | ICD-10-CM | POA: Diagnosis not present

## 2018-11-19 DIAGNOSIS — J3089 Other allergic rhinitis: Secondary | ICD-10-CM | POA: Diagnosis not present

## 2018-11-19 DIAGNOSIS — J301 Allergic rhinitis due to pollen: Secondary | ICD-10-CM | POA: Diagnosis not present

## 2018-11-19 DIAGNOSIS — J3081 Allergic rhinitis due to animal (cat) (dog) hair and dander: Secondary | ICD-10-CM | POA: Diagnosis not present

## 2018-11-26 DIAGNOSIS — J3081 Allergic rhinitis due to animal (cat) (dog) hair and dander: Secondary | ICD-10-CM | POA: Diagnosis not present

## 2018-11-26 DIAGNOSIS — J301 Allergic rhinitis due to pollen: Secondary | ICD-10-CM | POA: Diagnosis not present

## 2018-11-26 DIAGNOSIS — J3089 Other allergic rhinitis: Secondary | ICD-10-CM | POA: Diagnosis not present

## 2018-12-04 DIAGNOSIS — J3089 Other allergic rhinitis: Secondary | ICD-10-CM | POA: Diagnosis not present

## 2018-12-04 DIAGNOSIS — J3081 Allergic rhinitis due to animal (cat) (dog) hair and dander: Secondary | ICD-10-CM | POA: Diagnosis not present

## 2018-12-18 DIAGNOSIS — J3089 Other allergic rhinitis: Secondary | ICD-10-CM | POA: Diagnosis not present

## 2018-12-18 DIAGNOSIS — J3081 Allergic rhinitis due to animal (cat) (dog) hair and dander: Secondary | ICD-10-CM | POA: Diagnosis not present

## 2018-12-18 DIAGNOSIS — J301 Allergic rhinitis due to pollen: Secondary | ICD-10-CM | POA: Diagnosis not present

## 2018-12-24 DIAGNOSIS — J3081 Allergic rhinitis due to animal (cat) (dog) hair and dander: Secondary | ICD-10-CM | POA: Diagnosis not present

## 2018-12-24 DIAGNOSIS — J3089 Other allergic rhinitis: Secondary | ICD-10-CM | POA: Diagnosis not present

## 2019-01-05 ENCOUNTER — Ambulatory Visit (INDEPENDENT_AMBULATORY_CARE_PROVIDER_SITE_OTHER): Payer: BLUE CROSS/BLUE SHIELD | Admitting: Family Medicine

## 2019-01-05 ENCOUNTER — Encounter: Payer: Self-pay | Admitting: Family Medicine

## 2019-01-05 ENCOUNTER — Other Ambulatory Visit: Payer: Self-pay

## 2019-01-05 ENCOUNTER — Encounter: Payer: BLUE CROSS/BLUE SHIELD | Admitting: Family Medicine

## 2019-01-05 VITALS — BP 100/70 | HR 67 | Temp 97.6°F | Resp 16 | Ht 68.0 in | Wt 149.2 lb

## 2019-01-05 DIAGNOSIS — T18128A Food in esophagus causing other injury, initial encounter: Secondary | ICD-10-CM | POA: Diagnosis not present

## 2019-01-05 DIAGNOSIS — Z0000 Encounter for general adult medical examination without abnormal findings: Secondary | ICD-10-CM

## 2019-01-05 DIAGNOSIS — K222 Esophageal obstruction: Secondary | ICD-10-CM

## 2019-01-05 LAB — CBC WITH DIFFERENTIAL/PLATELET
Basophils Absolute: 0 10*3/uL (ref 0.0–0.1)
Basophils Relative: 0.9 % (ref 0.0–3.0)
Eosinophils Absolute: 0.1 10*3/uL (ref 0.0–0.7)
Eosinophils Relative: 2 % (ref 0.0–5.0)
HCT: 42 % (ref 39.0–52.0)
Hemoglobin: 14.2 g/dL (ref 13.0–17.0)
Lymphocytes Relative: 25.9 % (ref 12.0–46.0)
Lymphs Abs: 1.2 10*3/uL (ref 0.7–4.0)
MCHC: 33.9 g/dL (ref 30.0–36.0)
MCV: 87.2 fl (ref 78.0–100.0)
Monocytes Absolute: 0.4 10*3/uL (ref 0.1–1.0)
Monocytes Relative: 9.1 % (ref 3.0–12.0)
Neutro Abs: 2.9 10*3/uL (ref 1.4–7.7)
Neutrophils Relative %: 62.1 % (ref 43.0–77.0)
Platelets: 177 10*3/uL (ref 150.0–400.0)
RBC: 4.81 Mil/uL (ref 4.22–5.81)
RDW: 13.6 % (ref 11.5–15.5)
WBC: 4.7 10*3/uL (ref 4.0–10.5)

## 2019-01-05 LAB — COMPREHENSIVE METABOLIC PANEL
ALT: 19 U/L (ref 0–53)
AST: 26 U/L (ref 0–37)
Albumin: 4.5 g/dL (ref 3.5–5.2)
Alkaline Phosphatase: 62 U/L (ref 39–117)
BUN: 23 mg/dL (ref 6–23)
CO2: 30 mEq/L (ref 19–32)
Calcium: 9.5 mg/dL (ref 8.4–10.5)
Chloride: 105 mEq/L (ref 96–112)
Creatinine, Ser: 0.88 mg/dL (ref 0.40–1.50)
GFR: 93.09 mL/min (ref >60.00)
Glucose, Bld: 87 mg/dL (ref 70–99)
Potassium: 4.4 mEq/L (ref 3.5–5.1)
Sodium: 142 mEq/L (ref 135–145)
Total Bilirubin: 0.9 mg/dL (ref 0.2–1.2)
Total Protein: 6.1 g/dL (ref 6.0–8.3)

## 2019-01-05 LAB — LIPID PANEL
Cholesterol: 155 mg/dL (ref 0–200)
HDL: 61.9 mg/dL (ref >39.00)
LDL Cholesterol: 83 mg/dL (ref 0–99)
NonHDL: 92.95
Total CHOL/HDL Ratio: 3
Triglycerides: 50 mg/dL (ref 0.0–149.0)
VLDL: 10 mg/dL (ref 0.0–40.0)

## 2019-01-05 LAB — TSH: TSH: 1.52 u[IU]/mL (ref 0.35–4.50)

## 2019-01-05 NOTE — Progress Notes (Signed)
Office Note 01/05/2019  CC:  Chief Complaint  Patient presents with  . Annual Exam    pt is fasting    HPI:  Travis Reeves is a 48 y.o. White male who is here for annual health maintenance exam.  Still running regularly, 50-60 miles per week. Fairly healthy, trying to increase fruits and veggies.  He is having episodes of severe retrosternal fullnesss, describes feeling food bolus stuck in mid-to-distal esophagus region.  No dysphagia.  This feeling only comes with swallowing solids.  More likely to happen with meats, but essentially any solid can do it. Occurring for the last few years, insidious onset, getting more frequent despite chewing food extra well/eating slowly. Occurs avg of 2 times a week now.  Occ forces the bolus back out by coughing/forced regurgitation. It sometimes takes 1 minute to pass into stomach and sometimes it takes up to 20 min. BETWEEN EPISODES HE DENIES ANY SYMPTOMS WHATSOEVER. Denies episodes of GER or LPR.  No epigastric pain or dyspepsia or gas. No pills have ever gotten stuck.    Past Medical History:  Diagnosis Date  . Allergic rhinitis    Pollen,cat and dog dander, chron allerg conjunctivitis (Dr. Zenaida Niece Winkle)--gets allergy immunotherapy injections.  . Anxiety    PMH of  . BPH with obstruction/lower urinary tract symptoms 12/2017   Urol started flomax  . Contusion of left ankle 04/2018   Xray nl.  Reassured by Ortho (Dr. Madelon Lips)  . Dyspepsia   . Mild intermittent asthma     Past Surgical History:  Procedure Laterality Date  . VASECTOMY  07/2008   Dr. Patsi Sears    Family History  Problem Relation Age of Onset  . Hypertension Mother   . Hyperlipidemia Mother   . Diabetes Mother   . Migraines Mother   . Pancreatic cancer Mother        died 45  . Depression Father   . Arthritis Father   . Hypertension Father   . Hyperlipidemia Father   . Heart attack Father        > 55; S/P 2 stents  . Diabetes Father   . Colon polyps  Father   . Cancer Paternal Grandmother        colon & stomach  . Transient ischemic attack Paternal Grandfather   . Breast cancer Paternal Aunt   . Allergy (severe) Son        on shots    Social History   Socioeconomic History  . Marital status: Married    Spouse name: Not on file  . Number of children: 2  . Years of education: 4  . Highest education level: Not on file  Occupational History  . Occupation: Airline pilot Rep  Social Needs  . Financial resource strain: Not on file  . Food insecurity:    Worry: Not on file    Inability: Not on file  . Transportation needs:    Medical: Not on file    Non-medical: Not on file  Tobacco Use  . Smoking status: Never Smoker  . Smokeless tobacco: Never Used  Substance and Sexual Activity  . Alcohol use: Yes    Comment:  occasionally  . Drug use: No  . Sexual activity: Not on file  Lifestyle  . Physical activity:    Days per week: Not on file    Minutes per session: Not on file  . Stress: Not on file  Relationships  . Social connections:    Talks on phone: Not  on file    Gets together: Not on file    Attends religious service: Not on file    Active member of club or organization: Not on file    Attends meetings of clubs or organizations: Not on file    Relationship status: Not on file  . Intimate partner violence:    Fear of current or ex partner: Not on file    Emotionally abused: Not on file    Physically abused: Not on file    Forced sexual activity: Not on file  Other Topics Concern  . Not on file  Social History Narrative   Married, 2 boys.   Educ: Bachelors in agricultural business.   Occup: Scientist, water qualityaccount manager.   No tob, some weekend alcohol.  No drugs.   Fun: Golf and exercise (running).    Outpatient Medications Prior to Visit  Medication Sig Dispense Refill  . alfuzosin (UROXATRAL) 10 MG 24 hr tablet Take 10 mg by mouth daily. Take 1 daily.    . cetirizine (ZYRTEC) 10 MG tablet Take 10 mg by mouth daily.    .  fluticasone (FLONASE) 50 MCG/ACT nasal spray Place 1 spray into both nostrils daily. Must see new provider for future refills 16 g 0  . albuterol (PROVENTIL HFA) 108 (90 Base) MCG/ACT inhaler Inhale 2 puffs into the lungs every 6 (six) hours as needed for wheezing or shortness of breath. (Patient not taking: Reported on 01/05/2019) 1 Inhaler 1  . EPINEPHrine 0.3 mg/0.3 mL IJ SOAJ injection INJ UTD  1  . ibuprofen (ADVIL,MOTRIN) 200 MG tablet Take 200 mg by mouth every 6 (six) hours as needed.      . fexofenadine (ALLEGRA) 180 MG tablet Take 180 mg by mouth daily.     No facility-administered medications prior to visit.     Allergies  Allergen Reactions  . Zithromax [Azithromycin Dihydrate]     Nausea & vomiting    ROS Review of Systems  Constitutional: Negative for appetite change, chills, fatigue and fever.  HENT: Negative for congestion, dental problem, ear pain and sore throat.   Eyes: Negative for discharge, redness and visual disturbance.  Respiratory: Negative for cough, chest tightness, shortness of breath and wheezing.   Cardiovascular: Negative for chest pain, palpitations and leg swelling.  Gastrointestinal: Negative for abdominal pain, blood in stool, diarrhea, nausea and vomiting.  Endocrine: Negative for cold intolerance, heat intolerance, polydipsia, polyphagia and polyuria.  Genitourinary: Negative for difficulty urinating, dysuria, flank pain, frequency, hematuria and urgency.  Musculoskeletal: Negative for arthralgias, back pain, joint swelling, myalgias and neck stiffness.  Skin: Negative for pallor and rash.  Neurological: Negative for dizziness, speech difficulty, weakness and headaches.  Hematological: Negative for adenopathy. Does not bruise/bleed easily.  Psychiatric/Behavioral: Negative for confusion and sleep disturbance. The patient is not nervous/anxious.     PE; Blood pressure 100/70, pulse 67, temperature 97.6 F (36.4 C), temperature source Oral, resp.  rate 16, height 5\' 8"  (1.727 m), weight 149 lb 3.2 oz (67.7 kg), SpO2 96 %. Body mass index is 22.69 kg/m.  Gen: Alert, well appearing.  Patient is oriented to person, place, time, and situation. AFFECT: pleasant, lucid thought and speech. ENT: Ears: EACs clear, normal epithelium.  TMs with good light reflex and landmarks bilaterally.  Eyes: no injection, icteris, swelling, or exudate.  EOMI, PERRLA. Nose: no drainage or turbinate edema/swelling.  No injection or focal lesion.  Mouth: lips without lesion/swelling.  Oral mucosa pink and moist.  Dentition intact and without obvious  caries or gingival swelling.  Oropharynx without erythema, exudate, or swelling.  Neck: supple/nontender.  No LAD, mass, or TM.  Carotid pulses 2+ bilaterally, without bruits. CV: RRR, no m/r/g.   LUNGS: CTA bilat, nonlabored resps, good aeration in all lung fields. ABD: soft, NT, ND, BS normal.  No hepatospenomegaly or mass.  No bruits. EXT: no clubbing, cyanosis, or edema.  Musculoskeletal: no joint swelling, erythema, warmth, or tenderness.  ROM of all joints intact. Skin - no sores or suspicious lesions or rashes or color changes   Pertinent labs:  Lab Results  Component Value Date   TSH 1.53 01/01/2018   Lab Results  Component Value Date   WBC 4.7 01/01/2018   HGB 14.7 01/01/2018   HCT 44.1 01/01/2018   MCV 86.5 01/01/2018   PLT 190.0 01/01/2018   Lab Results  Component Value Date   CREATININE 1.11 01/15/2018   BUN 25 (H) 01/15/2018   NA 140 01/15/2018   K 4.8 01/15/2018   CL 105 01/15/2018   CO2 29 01/15/2018   Lab Results  Component Value Date   ALT 17 01/01/2018   AST 21 01/01/2018   ALKPHOS 67 01/01/2018   BILITOT 0.6 01/01/2018   Lab Results  Component Value Date   CHOL 157 01/01/2018   Lab Results  Component Value Date   HDL 61.50 01/01/2018   Lab Results  Component Value Date   LDLCALC 87 01/01/2018   Lab Results  Component Value Date   TRIG 42.0 01/01/2018   Lab  Results  Component Value Date   CHOLHDL 3 01/01/2018    ASSESSMENT AND PLAN:   1) Episodic esophageal obstruction: ? esoph spasm vs ? Achalasia vs ?mass? No symptoms at all between episodes. No new meds at this time. GI referral discussed and pt agreeable to this plan.  2) Health maintenance exam: Reviewed age and gender appropriate health maintenance issues (prudent diet, regular exercise, health risks of tobacco and excessive alcohol, use of seatbelts, fire alarms in home, use of sunscreen).  Also reviewed age and gender appropriate health screening as well as vaccine recommendations. Vaccines: all UTD. Labs: fasting HP. Prostate ca screening: average risk patient= as per latest guidelines, start screening at 63 yrs of age. Colon ca screening: average risk patient= as per latest guidelines, start screening at 29 yrs of age.  An After Visit Summary was printed and given to the patient.  FOLLOW UP:  Return in about 1 year (around 01/05/2020) for annual CPE (fasting).  Signed:  Santiago Bumpers, MD           01/05/2019

## 2019-01-05 NOTE — Patient Instructions (Signed)

## 2019-01-08 DIAGNOSIS — J301 Allergic rhinitis due to pollen: Secondary | ICD-10-CM | POA: Diagnosis not present

## 2019-01-08 DIAGNOSIS — J3089 Other allergic rhinitis: Secondary | ICD-10-CM | POA: Diagnosis not present

## 2019-01-08 DIAGNOSIS — J3081 Allergic rhinitis due to animal (cat) (dog) hair and dander: Secondary | ICD-10-CM | POA: Diagnosis not present

## 2019-01-13 ENCOUNTER — Ambulatory Visit (INDEPENDENT_AMBULATORY_CARE_PROVIDER_SITE_OTHER): Payer: BC Managed Care – PPO | Admitting: Internal Medicine

## 2019-01-13 ENCOUNTER — Encounter: Payer: Self-pay | Admitting: Internal Medicine

## 2019-01-13 ENCOUNTER — Other Ambulatory Visit: Payer: Self-pay

## 2019-01-13 DIAGNOSIS — R1310 Dysphagia, unspecified: Secondary | ICD-10-CM

## 2019-01-13 DIAGNOSIS — R1319 Other dysphagia: Secondary | ICD-10-CM

## 2019-01-13 DIAGNOSIS — K200 Eosinophilic esophagitis: Secondary | ICD-10-CM

## 2019-01-13 HISTORY — DX: Eosinophilic esophagitis: K20.0

## 2019-01-13 NOTE — Patient Instructions (Addendum)
Nice to meet you today and I look forward to helping you with your problem.  As we discussed we will arrange an upper GI endoscopy with esophageal dilation to evaluate and treat the swallowing problem also known as dysphagia.   I appreciate the opportunity to care for you. Iva Boop, MD, Clementeen Graham

## 2019-01-13 NOTE — Assessment & Plan Note (Signed)
Episodic and intermittent solid dysphagia w/o much reflux sx, no weight loss. 2 main possibilities are Eosinophilic esophagitis (allergy hx) and/or peptic stricture from GERD.  EGD to evaluate, esophageal dilation likely. The risks and benefits as well as alternatives of endoscopic procedure(s) have been discussed and reviewed. All questions answered. The patient agrees to proceed.  Discussed we are trying to prevent but cannot guarantee Covid-19 transmission during in-person encounters.

## 2019-01-13 NOTE — Progress Notes (Signed)
    TELEHEALTH ENCOUNTER IN SETTING OF COVID-19 PANDEMIC - REQUESTED BY PATIENT SERVICE PROVIDED BY TELEMEDECINE - TYPE: Zoom PATIENT LOCATION:home PATIENT HAS CONSENTED TO TELEHEALTH VISIT PROVIDER LOCATION: OFFICE REFERRING PROVIDER:McGowen, Maryjean Morn, MD PARTICIPANTS OTHER THAN PATIENT:none TIME SPENT ON CALL:12 minutes (MD time)    Travis Reeves 48 y.o. 1971-03-13 [id number]  Assessment & Plan:   Esophageal dysphagia Episodic and intermittent solid dysphagia w/o much reflux sx, no weight loss. 2 main possibilities are Eosinophilic esophagitis (allergy hx) and/or peptic stricture from GERD.  EGD to evaluate, esophageal dilation likely. The risks and benefits as well as alternatives of endoscopic procedure(s) have been discussed and reviewed. All questions answered. The patient agrees to proceed.  Discussed we are trying to prevent but cannot guarantee Covid-19 transmission during in-person encounters.   I appreciate the opportunity to care for this patient.   YJ:EHUDJSH, Maryjean Morn, MD   Subjective:   Chief Complaint: dysphagia  HPI  Several month + hx of intermittent solid food dysphagia. Meat is worst - mid-sternal sticking point. No weight loss. Mild rare heartburn. Does dring a lot of coffee. Runs frequently. Does have allergic rhinitis and mild asthma  All other GI ROS negative  Allergies  Allergen Reactions  . Zithromax [Azithromycin Dihydrate]     Nausea & vomiting   Current Meds  Medication Sig  . albuterol (PROVENTIL HFA) 108 (90 Base) MCG/ACT inhaler Inhale 2 puffs into the lungs every 6 (six) hours as needed for wheezing or shortness of breath.  . alfuzosin (UROXATRAL) 10 MG 24 hr tablet Take 10 mg by mouth daily. Take 1 daily.  . cetirizine (ZYRTEC) 10 MG tablet Take 10 mg by mouth daily.  Marland Kitchen EPINEPHrine 0.3 mg/0.3 mL IJ SOAJ injection INJ UTD  . fluticasone (FLONASE) 50 MCG/ACT nasal spray Place 1 spray into both nostrils daily. Must see new  provider for future refills  . ibuprofen (ADVIL,MOTRIN) 200 MG tablet Take 200 mg by mouth every 6 (six) hours as needed.     Past Medical History:  Diagnosis Date  . Allergic rhinitis    Pollen,cat and dog dander, chron allerg conjunctivitis (Dr. Zenaida Niece Winkle)--gets allergy immunotherapy injections.  . Anxiety    PMH of  . BPH with obstruction/lower urinary tract symptoms 12/2017   Urol started flomax  . Contusion of left ankle 04/2018   Xray nl.  Reassured by Ortho (Dr. Madelon Lips)  . Dyspepsia   . Mild intermittent asthma    Past Surgical History:  Procedure Laterality Date  . VASECTOMY  07/2008   Dr. Patsi Sears   Social History   Social History Narrative   Married, 2 boys.   Educ: Bachelors in agricultural business.   Occup: Scientist, water quality.   No tob, some weekend alcohol.  No drugs.   Fun: Golf and exercise (running).   family history includes Allergy (severe) in his son; Arthritis in his father; Breast cancer in his paternal aunt; Cancer in his paternal grandmother; Colon polyps in his father; Depression in his father; Diabetes in his father and mother; Heart attack in his father; Hyperlipidemia in his father and mother; Hypertension in his father and mother; Migraines in his mother; Pancreatic cancer in his mother; Transient ischemic attack in his paternal grandfather.   Review of Systems Al other ROS negative

## 2019-01-20 ENCOUNTER — Encounter: Payer: BC Managed Care – PPO | Admitting: Internal Medicine

## 2019-01-21 DIAGNOSIS — J3089 Other allergic rhinitis: Secondary | ICD-10-CM | POA: Diagnosis not present

## 2019-01-21 DIAGNOSIS — J301 Allergic rhinitis due to pollen: Secondary | ICD-10-CM | POA: Diagnosis not present

## 2019-01-21 DIAGNOSIS — J3081 Allergic rhinitis due to animal (cat) (dog) hair and dander: Secondary | ICD-10-CM | POA: Diagnosis not present

## 2019-01-28 ENCOUNTER — Telehealth: Payer: Self-pay | Admitting: Internal Medicine

## 2019-01-28 DIAGNOSIS — J3081 Allergic rhinitis due to animal (cat) (dog) hair and dander: Secondary | ICD-10-CM | POA: Diagnosis not present

## 2019-01-28 DIAGNOSIS — J3089 Other allergic rhinitis: Secondary | ICD-10-CM | POA: Diagnosis not present

## 2019-01-28 DIAGNOSIS — J301 Allergic rhinitis due to pollen: Secondary | ICD-10-CM | POA: Diagnosis not present

## 2019-01-28 NOTE — Telephone Encounter (Signed)
Patient called back to answer questions his response was no to all except. Have you traveled in the last 14 days? He answered yes to [facility]

## 2019-01-28 NOTE — Telephone Encounter (Signed)
Ok thanks for letting me know. The patient is not waiting for a call back.

## 2019-01-28 NOTE — Telephone Encounter (Signed)
Left message to call back to ask Covid-19 screening questions. °Covid-19 Screening Questions: ° °Do you now or have you had a fever in the last 14 days?  ° °Do you have any respiratory symptoms of shortness of breath or cough now or in the last 14 days?  ° °Do you have any family members or close contacts with diagnosed or suspected Covid-19 in the past 14 days?  ° °Have you been tested for Covid-19 and found to be positive?  ° °Pt made aware of that care partner may wait in the car or come up to the lobby during the procedure but will need to provide their own mask. °

## 2019-01-28 NOTE — Telephone Encounter (Signed)
It's my understanding that we don't have to ask about travel anymore and since no to other questions he should be good to go. Is he expecting a call back from [place] on this?

## 2019-01-29 ENCOUNTER — Ambulatory Visit (AMBULATORY_SURGERY_CENTER): Payer: BC Managed Care – PPO | Admitting: Internal Medicine

## 2019-01-29 ENCOUNTER — Encounter: Payer: Self-pay | Admitting: Internal Medicine

## 2019-01-29 ENCOUNTER — Other Ambulatory Visit: Payer: Self-pay

## 2019-01-29 VITALS — BP 128/65 | HR 54 | Temp 97.5°F | Resp 14 | Ht 69.0 in | Wt 142.0 lb

## 2019-01-29 DIAGNOSIS — K209 Esophagitis, unspecified: Secondary | ICD-10-CM | POA: Diagnosis not present

## 2019-01-29 DIAGNOSIS — R1310 Dysphagia, unspecified: Secondary | ICD-10-CM

## 2019-01-29 DIAGNOSIS — K222 Esophageal obstruction: Secondary | ICD-10-CM

## 2019-01-29 DIAGNOSIS — K200 Eosinophilic esophagitis: Secondary | ICD-10-CM

## 2019-01-29 HISTORY — PX: ESOPHAGOGASTRODUODENOSCOPY: SHX1529

## 2019-01-29 MED ORDER — SODIUM CHLORIDE 0.9 % IV SOLN: 500.0000 mL | Freq: Once | INTRAVENOUS | Status: DC

## 2019-01-29 NOTE — Progress Notes (Signed)
Called to room to assist during endoscopic procedure.  Patient ID and intended procedure confirmed with present staff. Received instructions for my participation in the procedure from the performing physician.  

## 2019-01-29 NOTE — Progress Notes (Signed)
Report given to PACU, vss 

## 2019-01-29 NOTE — Op Note (Signed)
[facility] Patient Name: Travis RobertsonDouglas Hobday Procedure Date: 01/29/2019 9:27 AM MRN: [id number] Endoscopist: Iva Booparl E Gessner , MD Age: 48 Referring MD:  Date of Birth: 1971/07/28 Gender: Male Account #: [id number] Procedure:                Upper GI endoscopy Indications:              Dysphagia Medicines:                Propofol per Anesthesia, Monitored Anesthesia Care Procedure:                Pre-Anesthesia Assessment:                           - Prior to the procedure, a History and Physical                            was performed, and patient medications and                            allergies were reviewed. The patient's tolerance of                            previous anesthesia was also reviewed. The risks                            and benefits of the procedure and the sedation                            options and risks were discussed with the patient.                            All questions were answered, and informed consent                            was obtained. Prior Anticoagulants: The patient has                            taken no previous anticoagulant or antiplatelet                            agents. ASA Grade Assessment: II - A patient with                            mild systemic disease. After reviewing the risks                            and benefits, the patient was deemed in                            satisfactory condition to undergo the procedure.                           After obtaining informed consent, the endoscope was  passed under direct vision. Throughout the                            procedure, the patient's blood pressure, pulse, and                            oxygen saturations were monitored continuously. The                            Endoscope was introduced through the mouth, and                            advanced to the second part of duodenum. The upper                            GI endoscopy was  accomplished without difficulty.                            The patient tolerated the procedure well. Scope In: Scope Out: Findings:                 Mucosal changes including longitudinal furrows,                            white plaques and stenosis were found in the entire                            esophagus. Biopsies were obtained from the proximal                            and distal esophagus with cold forceps for                            histology of suspected eosinophilic esophagitis.                            Verification of patient identification for the                            specimen was done. Estimated blood loss was                            minimal. The scope was withdrawn. Dilation was                            performed with a Maloney dilator with moderate                            resistance at 54 Fr. The dilation site was examined                            following endoscope reinsertion and showed mild  mucosal disruption. Estimated blood loss was                            minimal.                           The exam was otherwise without abnormality.                           The cardia and gastric fundus were normal on                            retroflexion. Complications:            No immediate complications. Estimated Blood Loss:     Estimated blood loss was minimal. Impression:               - Esophageal mucosal changes suggestive of                            eosinophilic esophagitis. Biopsied. Dilated.                            Ring-like stenosis (mild) at GE junction - slight                            mucosal disruption seen post dilation                           - The examination was otherwise normal. Recommendation:           - Patient has a contact number available for                            emergencies. The signs and symptoms of potential                            delayed complications were discussed with the                             patient. Return to normal activities tomorrow.                            Written discharge instructions were provided to the                            patient.                           - Clear liquids x 1 hour then soft foods rest of                            day. Start prior diet tomorrow.                           - Continue present medications.                           -  Await pathology results. Will determine treatment                            plan after review. Iva Booparl E Gessner, MD 01/29/2019 9:47:05 AM This report has been signed electronically.

## 2019-01-29 NOTE — Patient Instructions (Addendum)
It looks like you have eosinophilic esophagitis. I took biopsies and dilated the esophagus. Once I get results I will contact you with further recommendations.   In eosinophilic esophagitis (e-o-sin-o-FILL-ik uh-sof-uh-JIE-tis), a type of white blood cell (eosinophil) builds up in the lining of the tube that connects your mouth to your stomach (esophagus). This buildup, which is a reaction to foods, allergens or acid reflux, can inflame or injure the esophageal tissue. Damaged esophageal tissue can lead to difficulty swallowing or cause food to get caught when you swallow.  Eosinophilic esophagitis is a chronic immune system disease. It has been identified only in the past two decades, but is now considered a major cause of digestive system (gastrointestinal) illness. Research is ongoing and will likely lead to revisions in its diagnosis and treatment.  I appreciate the opportunity to care for you. Iva Booparl E. Kyrus Hyde, MD, FACG  YOU HAD AN ENDOSCOPIC PROCEDURE TODAY AT THE [facility]:   Refer to the procedure report that was given to you for any specific questions about what was found during the examination.  If the procedure report does not answer your questions, please call your gastroenterologist to clarify.  If you requested that your care partner not be given the details of your procedure findings, then the procedure report has been included in a sealed envelope for you to review at your convenience later.  YOU SHOULD EXPECT: Some feelings of bloating in the abdomen. Passage of more gas than usual.  Walking can help get rid of the air that was put into your GI tract during the procedure and reduce the bloating. If you had a lower endoscopy (such as a colonoscopy or flexible sigmoidoscopy) you may notice spotting of blood in your stool or on the toilet paper. If you underwent a bowel prep for your procedure, you may not have a normal bowel movement for a few days.  Please Note:   You might notice some irritation and congestion in your nose or some drainage.  This is from the oxygen used during your procedure.  There is no need for concern and it should clear up in a day or so.  SYMPTOMS TO REPORT IMMEDIATELY:    Following upper endoscopy (EGD)  Vomiting of blood or coffee ground material  New chest pain or pain under the shoulder blades  Painful or persistently difficult swallowing  New shortness of breath  Fever of 100F or higher  Black, tarry-looking stools  For urgent or emergent issues, a gastroenterologist can be reached at any hour by calling [phone].   DIET:  Nothing by mouth until 10:45, clear liquids until 11:45, soft foods for the rest of today.  Tomorrow you may proceed to your regular diet.  Drink plenty of fluids but you should avoid alcoholic beverages for 24 hours.  ACTIVITY:  You should plan to take it easy for the rest of today and you should NOT DRIVE or use heavy machinery until tomorrow (because of the sedation medicines used during the test).    FOLLOW UP: Our staff will call the number listed on your records 48-72 hours following your procedure to check on you and address any questions or concerns that you may have regarding the information given to you following your procedure. If we do not reach you, we will leave a message.  We will attempt to reach you two times.  During this call, we will ask if you have developed any symptoms of COVID 19. If you develop  any symptoms (ie: fever, flu-like symptoms, shortness of breath, cough etc.) before then, please call [phone].  If you test positive for Covid 19 in the 2 weeks post procedure, please call and report this information to [place].    If any biopsies were taken you will be contacted by phone or by letter within the next 1-3 weeks.  Please call us at [phone] if you have not heard about the biopsies in 3 weeks.    SIGNATURES/CONFIDENTIALITY: You and/or your care partner have  signed paperwork which will be entered into your electronic medical record.  These signatures attest to the fact that that the information above on your After Visit Summary has been reviewed and is understood.  Full responsibility of the confidentiality of this discharge information lies with you and/or your care-partner.

## 2019-01-29 NOTE — Progress Notes (Signed)
Temps taken by [facility] V/S taken by [place] 

## 2019-02-02 ENCOUNTER — Telehealth: Payer: Self-pay

## 2019-02-02 NOTE — Telephone Encounter (Signed)
  Follow up Call-  Call back number 01/29/2019  Post procedure Call Back phone  # [phone]  Permission to leave phone message Yes  Some recent data might be hidden    1. Have you developed a fever since your procedure? no  2.   Have you had an respiratory symptoms (SOB or cough) since your procedure? no  3.   Have you tested positive for COVID 19 since your procedure no  4.   Have you had any family members/close contacts diagnosed with the COVID 19 since your procedure?  no   If yes to any of these questions please route to Joylene John, RN and Alphonsa Gin, Therapist, sports.  Patient questions:  Do you have a fever, pain , or abdominal swelling? No. Pain Score  0 *  Have you tolerated food without any problems? Yes.    Have you been able to return to your normal activities? Yes.    Do you have any questions about your discharge instructions: Diet   No. Medications  No. Follow up visit  No.  Do you have questions or concerns about your Care? No.  Actions: * If pain score is 4 or above: No action needed, pain <4.

## 2019-02-04 ENCOUNTER — Other Ambulatory Visit: Payer: Self-pay

## 2019-02-04 ENCOUNTER — Encounter: Payer: Self-pay | Admitting: Internal Medicine

## 2019-02-04 DIAGNOSIS — J3089 Other allergic rhinitis: Secondary | ICD-10-CM | POA: Diagnosis not present

## 2019-02-04 DIAGNOSIS — J3081 Allergic rhinitis due to animal (cat) (dog) hair and dander: Secondary | ICD-10-CM | POA: Diagnosis not present

## 2019-02-04 DIAGNOSIS — J301 Allergic rhinitis due to pollen: Secondary | ICD-10-CM | POA: Diagnosis not present

## 2019-02-04 MED ORDER — ESOMEPRAZOLE MAGNESIUM 40 MG PO CPDR: 40.0000 mg | DELAYED_RELEASE_CAPSULE | Freq: Every day | ORAL | 3 refills | Status: DC

## 2019-02-04 NOTE — Progress Notes (Signed)
No letter or recall from [facility]  Call patient and explain it looks like he does have eosinophilic esophagitis.  This often improves with PPI  Please start esomeprazole 40 mg po qd take 30 mins before breakfast - #[phone] 3 rf  Schedule f/u me in about 2 mos to further discuss  He may need to call for an August appointment when we have that open

## 2019-02-17 DIAGNOSIS — J3089 Other allergic rhinitis: Secondary | ICD-10-CM | POA: Diagnosis not present

## 2019-02-17 DIAGNOSIS — J301 Allergic rhinitis due to pollen: Secondary | ICD-10-CM | POA: Diagnosis not present

## 2019-02-17 DIAGNOSIS — J3081 Allergic rhinitis due to animal (cat) (dog) hair and dander: Secondary | ICD-10-CM | POA: Diagnosis not present

## 2019-02-26 DIAGNOSIS — J301 Allergic rhinitis due to pollen: Secondary | ICD-10-CM | POA: Diagnosis not present

## 2019-02-26 DIAGNOSIS — J3089 Other allergic rhinitis: Secondary | ICD-10-CM | POA: Diagnosis not present

## 2019-02-26 DIAGNOSIS — J3081 Allergic rhinitis due to animal (cat) (dog) hair and dander: Secondary | ICD-10-CM | POA: Diagnosis not present

## 2019-03-05 DIAGNOSIS — J3089 Other allergic rhinitis: Secondary | ICD-10-CM | POA: Diagnosis not present

## 2019-03-05 DIAGNOSIS — J301 Allergic rhinitis due to pollen: Secondary | ICD-10-CM | POA: Diagnosis not present

## 2019-03-05 DIAGNOSIS — J3081 Allergic rhinitis due to animal (cat) (dog) hair and dander: Secondary | ICD-10-CM | POA: Diagnosis not present

## 2019-03-12 DIAGNOSIS — J301 Allergic rhinitis due to pollen: Secondary | ICD-10-CM | POA: Diagnosis not present

## 2019-03-12 DIAGNOSIS — J3081 Allergic rhinitis due to animal (cat) (dog) hair and dander: Secondary | ICD-10-CM | POA: Diagnosis not present

## 2019-03-12 DIAGNOSIS — J3089 Other allergic rhinitis: Secondary | ICD-10-CM | POA: Diagnosis not present

## 2019-03-18 DIAGNOSIS — J301 Allergic rhinitis due to pollen: Secondary | ICD-10-CM | POA: Diagnosis not present

## 2019-03-18 DIAGNOSIS — J3089 Other allergic rhinitis: Secondary | ICD-10-CM | POA: Diagnosis not present

## 2019-03-18 DIAGNOSIS — J3081 Allergic rhinitis due to animal (cat) (dog) hair and dander: Secondary | ICD-10-CM | POA: Diagnosis not present

## 2019-03-26 DIAGNOSIS — J301 Allergic rhinitis due to pollen: Secondary | ICD-10-CM | POA: Diagnosis not present

## 2019-03-26 DIAGNOSIS — J3081 Allergic rhinitis due to animal (cat) (dog) hair and dander: Secondary | ICD-10-CM | POA: Diagnosis not present

## 2019-03-29 DIAGNOSIS — H1045 Other chronic allergic conjunctivitis: Secondary | ICD-10-CM | POA: Diagnosis not present

## 2019-03-29 DIAGNOSIS — J3089 Other allergic rhinitis: Secondary | ICD-10-CM | POA: Diagnosis not present

## 2019-03-29 DIAGNOSIS — J3081 Allergic rhinitis due to animal (cat) (dog) hair and dander: Secondary | ICD-10-CM | POA: Diagnosis not present

## 2019-03-29 DIAGNOSIS — J301 Allergic rhinitis due to pollen: Secondary | ICD-10-CM | POA: Diagnosis not present

## 2019-04-02 DIAGNOSIS — J3089 Other allergic rhinitis: Secondary | ICD-10-CM | POA: Diagnosis not present

## 2019-04-02 DIAGNOSIS — J3081 Allergic rhinitis due to animal (cat) (dog) hair and dander: Secondary | ICD-10-CM | POA: Diagnosis not present

## 2019-04-02 DIAGNOSIS — J301 Allergic rhinitis due to pollen: Secondary | ICD-10-CM | POA: Diagnosis not present

## 2019-04-07 ENCOUNTER — Ambulatory Visit: Payer: BC Managed Care – PPO | Admitting: Internal Medicine

## 2019-04-07 ENCOUNTER — Encounter: Payer: Self-pay | Admitting: Internal Medicine

## 2019-04-07 VITALS — BP 100/68 | HR 60 | Temp 98.0°F | Ht 68.0 in | Wt 150.4 lb

## 2019-04-07 DIAGNOSIS — K200 Eosinophilic esophagitis: Secondary | ICD-10-CM

## 2019-04-07 NOTE — Progress Notes (Signed)
   Travis Reeves 48 y.o. 1970/08/31 [id number]  Assessment & Plan:  Eosinophilic esophagitis Much improved s/p 68 Fr dilation and starting PPI Will continue Reviewed the nature of the disease His allergist Dr. Harold Hedge not yet aware of dx' Will cc him  Patient to discuss any other possible testing with him re: food allergies In my exp[erience often unrevealing Six food elimination diet very difficult to do For now PPI Call back prn increases sxs and decide topical budesonide vs repeat dilation if so Otherwise annual visit    Cc:Tammi Sou, MD Dr. Azzie Roup  Subjective:   Chief Complaint: f/u eosinophilic esophagitis  HPI Here for f/u after EGD 8/01 dx eosinophilic esophagitis - dilated 59 Fr. Only very rare mild dysphagia since. Started esomeprazole 40 mg daily. Satisfied with improvement. Allergies  Allergen Reactions  . Zithromax [Azithromycin Dihydrate]     Nausea & vomiting   Current Meds  Medication Sig  . albuterol (PROVENTIL HFA) 108 (90 Base) MCG/ACT inhaler Inhale 2 puffs into the lungs every 6 (six) hours as needed for wheezing or shortness of breath.  . alfuzosin (UROXATRAL) 10 MG 24 hr tablet Take 10 mg by mouth daily. Take 1 daily.  . cetirizine (ZYRTEC) 10 MG tablet Take 10 mg by mouth daily.  Marland Kitchen EPINEPHrine 0.3 mg/0.3 mL IJ SOAJ injection INJ UTD  . esomeprazole (NEXIUM) 40 MG capsule Take 1 capsule (40 mg total) by mouth daily at 12 noon.  . fluticasone (FLONASE) 50 MCG/ACT nasal spray Place 1 spray into both nostrils daily. Must see new provider for future refills  . ibuprofen (ADVIL,MOTRIN) 200 MG tablet Take 200 mg by mouth every 6 (six) hours as needed.     Past Medical History:  Diagnosis Date  . Allergic rhinitis    Pollen,cat and dog dander, chron allerg conjunctivitis (Dr. Lucianne Lei Winkle)--gets allergy immunotherapy injections.  . Anxiety    PMH of  . BPH with obstruction/lower urinary tract symptoms 12/2017   Urol started  flomax  . Contusion of left ankle 04/2018   Xray nl.  Reassured by Ortho (Dr. French Ana)  . Dyspepsia   . Eosinophilic esophagitis 01/14/5373  . Mild intermittent asthma    Past Surgical History:  Procedure Laterality Date  . VASECTOMY  07/2008   Dr. Gaynelle Arabian   Social History   Social History Narrative   Married, 2 boys.   Educ: Bachelors in agricultural business.   Occup: Passenger transport manager.   No tob, some weekend alcohol.  No drugs.   Fun: Golf and exercise (running).   family history includes Allergy (severe) in his son; Arthritis in his father; Breast cancer in his paternal aunt; Cancer in his paternal grandmother; Colon polyps in his father; Depression in his father; Diabetes in his father and mother; Heart attack in his father; Hyperlipidemia in his father and mother; Hypertension in his father and mother; Migraines in his mother; Pancreatic cancer in his mother; Transient ischemic attack in his paternal grandfather.   Review of Systems neg  Objective:   Physical Exam BP 100/68   Pulse 60   Temp 98 F (36.7 C) (Oral)   Ht 5\' 8"  (1.727 m)   Wt 150 lb 6.4 oz (68.2 kg)   BMI 22.87 kg/m  NAD

## 2019-04-07 NOTE — Patient Instructions (Signed)
Glad you are feeling better.  As we discussed - let me know if you are having increasing swallowing problems (hope not).  Also make an appointment with Dr. Harold Hedge to discuss the eosinophilic esophagitis also.  I appreciate the opportunity to care for you. Gatha Mayer, MD, [facility]

## 2019-04-08 DIAGNOSIS — J3081 Allergic rhinitis due to animal (cat) (dog) hair and dander: Secondary | ICD-10-CM | POA: Diagnosis not present

## 2019-04-08 DIAGNOSIS — J301 Allergic rhinitis due to pollen: Secondary | ICD-10-CM | POA: Diagnosis not present

## 2019-04-09 DIAGNOSIS — J3081 Allergic rhinitis due to animal (cat) (dog) hair and dander: Secondary | ICD-10-CM | POA: Diagnosis not present

## 2019-04-09 DIAGNOSIS — J3089 Other allergic rhinitis: Secondary | ICD-10-CM | POA: Diagnosis not present

## 2019-04-09 DIAGNOSIS — J301 Allergic rhinitis due to pollen: Secondary | ICD-10-CM | POA: Diagnosis not present

## 2019-04-10 ENCOUNTER — Encounter: Payer: Self-pay | Admitting: Family Medicine

## 2019-04-11 NOTE — Assessment & Plan Note (Signed)
Much improved s/p 60 Fr dilation and starting PPI Will continue Reviewed the nature of the disease His allergist Dr. Harold Hedge not yet aware of dx' Will cc him  Patient to discuss any other possible testing with him re: food allergies In my exp[erience often unrevealing Six food elimination diet very difficult to do For now PPI Call back prn increases sxs and decide topical budesonide vs repeat dilation if so Otherwise annual visit

## 2019-04-23 DIAGNOSIS — J3089 Other allergic rhinitis: Secondary | ICD-10-CM | POA: Diagnosis not present

## 2019-04-23 DIAGNOSIS — J3081 Allergic rhinitis due to animal (cat) (dog) hair and dander: Secondary | ICD-10-CM | POA: Diagnosis not present

## 2019-04-23 DIAGNOSIS — J301 Allergic rhinitis due to pollen: Secondary | ICD-10-CM | POA: Diagnosis not present

## 2019-04-30 DIAGNOSIS — J3081 Allergic rhinitis due to animal (cat) (dog) hair and dander: Secondary | ICD-10-CM | POA: Diagnosis not present

## 2019-04-30 DIAGNOSIS — J3089 Other allergic rhinitis: Secondary | ICD-10-CM | POA: Diagnosis not present

## 2019-04-30 DIAGNOSIS — J301 Allergic rhinitis due to pollen: Secondary | ICD-10-CM | POA: Diagnosis not present

## 2019-05-03 DIAGNOSIS — N400 Enlarged prostate without lower urinary tract symptoms: Secondary | ICD-10-CM | POA: Diagnosis not present

## 2019-05-07 DIAGNOSIS — J3089 Other allergic rhinitis: Secondary | ICD-10-CM | POA: Diagnosis not present

## 2019-05-07 DIAGNOSIS — J3081 Allergic rhinitis due to animal (cat) (dog) hair and dander: Secondary | ICD-10-CM | POA: Diagnosis not present

## 2019-05-07 DIAGNOSIS — J301 Allergic rhinitis due to pollen: Secondary | ICD-10-CM | POA: Diagnosis not present

## 2019-05-14 DIAGNOSIS — J301 Allergic rhinitis due to pollen: Secondary | ICD-10-CM | POA: Diagnosis not present

## 2019-05-14 DIAGNOSIS — J3089 Other allergic rhinitis: Secondary | ICD-10-CM | POA: Diagnosis not present

## 2019-05-14 DIAGNOSIS — J3081 Allergic rhinitis due to animal (cat) (dog) hair and dander: Secondary | ICD-10-CM | POA: Diagnosis not present

## 2019-06-02 ENCOUNTER — Encounter: Payer: Self-pay | Admitting: Family Medicine

## 2019-06-04 DIAGNOSIS — J301 Allergic rhinitis due to pollen: Secondary | ICD-10-CM | POA: Diagnosis not present

## 2019-06-04 DIAGNOSIS — J3089 Other allergic rhinitis: Secondary | ICD-10-CM | POA: Diagnosis not present

## 2019-06-04 DIAGNOSIS — J3081 Allergic rhinitis due to animal (cat) (dog) hair and dander: Secondary | ICD-10-CM | POA: Diagnosis not present

## 2019-06-18 DIAGNOSIS — J3081 Allergic rhinitis due to animal (cat) (dog) hair and dander: Secondary | ICD-10-CM | POA: Diagnosis not present

## 2019-06-18 DIAGNOSIS — J301 Allergic rhinitis due to pollen: Secondary | ICD-10-CM | POA: Diagnosis not present

## 2019-06-18 DIAGNOSIS — J3089 Other allergic rhinitis: Secondary | ICD-10-CM | POA: Diagnosis not present

## 2019-07-01 DIAGNOSIS — J3081 Allergic rhinitis due to animal (cat) (dog) hair and dander: Secondary | ICD-10-CM | POA: Diagnosis not present

## 2019-07-01 DIAGNOSIS — J301 Allergic rhinitis due to pollen: Secondary | ICD-10-CM | POA: Diagnosis not present

## 2019-07-01 DIAGNOSIS — J3089 Other allergic rhinitis: Secondary | ICD-10-CM | POA: Diagnosis not present

## 2019-07-12 DIAGNOSIS — J3089 Other allergic rhinitis: Secondary | ICD-10-CM | POA: Diagnosis not present

## 2019-07-12 DIAGNOSIS — J3081 Allergic rhinitis due to animal (cat) (dog) hair and dander: Secondary | ICD-10-CM | POA: Diagnosis not present

## 2019-07-15 ENCOUNTER — Other Ambulatory Visit: Payer: Self-pay

## 2019-07-15 DIAGNOSIS — J3081 Allergic rhinitis due to animal (cat) (dog) hair and dander: Secondary | ICD-10-CM | POA: Diagnosis not present

## 2019-07-15 DIAGNOSIS — J3089 Other allergic rhinitis: Secondary | ICD-10-CM | POA: Diagnosis not present

## 2019-07-16 ENCOUNTER — Encounter: Payer: Self-pay | Admitting: Family Medicine

## 2019-07-16 ENCOUNTER — Ambulatory Visit: Payer: BC Managed Care – PPO | Admitting: Family Medicine

## 2019-07-16 ENCOUNTER — Other Ambulatory Visit: Payer: Self-pay

## 2019-07-16 VITALS — BP 114/75 | HR 54 | Temp 98.2°F | Resp 16 | Ht 68.0 in | Wt 150.2 lb

## 2019-07-16 DIAGNOSIS — R091 Pleurisy: Secondary | ICD-10-CM | POA: Diagnosis not present

## 2019-07-16 DIAGNOSIS — M898X1 Other specified disorders of bone, shoulder: Secondary | ICD-10-CM

## 2019-07-16 NOTE — Progress Notes (Signed)
OFFICE VISIT  07/16/2019   CC:  Chief Complaint  Patient presents with  . Back Pain    hurts when taking deep breaths  . Shoulder Pain    left, 2 weeks   HPI:    Patient is a 48 y.o. Caucasian male who presents for pain in shoulder blade on left. Onset about 2 wks ago L scapular region pain--mildly sharp--only with taking deep breath or sneezing. Mild/mod intensity, not impairing any activities or normal functioning. Runs/plays golf and this doesn't cause pain. No radiation of pain.  No f/c/cough. No viral illness preceding onset. No preceding excessive use/overuse related to the region of pain. No known skin abnormality. No med tried. He is not bothered at all by it other than when taking a deep breath.  ROS: no CP, no SOB, no wheezing, no cough, no dizziness, no HAs, no rashes, no melena/hematochezia.  No polyuria or polydipsia.  No myalgias or arthralgias.   Past Medical History:  Diagnosis Date  . Allergic rhinitis    Pollen,cat and dog dander, chron allerg conjunctivitis (Dr. Lucianne Lei Winkle)--gets allergy immunotherapy injections.  . Anxiety    PMH of  . BPH with obstruction/lower urinary tract symptoms 12/2017   doing well on alfuzosin as of 04/2019 urol f/u  . Contusion of left ankle 04/2018   Xray nl.  Reassured by Ortho (Dr. French Ana)  . Eosinophilic esophagitis 08/20/3233   Esoph dilation, started PPI 01/2019.  . Mild intermittent asthma     Past Surgical History:  Procedure Laterality Date  . ESOPHAGOGASTRODUODENOSCOPY  57/32/2025   Eosinophilic esophagitis->esoph dilation  . VASECTOMY  07/2008   Dr. Gaynelle Arabian    Outpatient Medications Prior to Visit  Medication Sig Dispense Refill  . albuterol (PROVENTIL HFA) 108 (90 Base) MCG/ACT inhaler Inhale 2 puffs into the lungs every 6 (six) hours as needed for wheezing or shortness of breath. 1 Inhaler 1  . alfuzosin (UROXATRAL) 10 MG 24 hr tablet Take 10 mg by mouth daily. Take 1 daily.    . cetirizine (ZYRTEC) 10  MG tablet Take 10 mg by mouth daily.    Marland Kitchen esomeprazole (NEXIUM) 40 MG capsule Take 1 capsule (40 mg total) by mouth daily before breakfast. 90 capsule 3  . fluticasone (FLONASE) 50 MCG/ACT nasal spray Place 1 spray into both nostrils daily. Must see new provider for future refills 16 g 0  . ibuprofen (ADVIL,MOTRIN) 200 MG tablet Take 200 mg by mouth every 6 (six) hours as needed.      Marland Kitchen levocetirizine (XYZAL) 5 MG tablet     . EPINEPHrine 0.3 mg/0.3 mL IJ SOAJ injection INJ UTD  1   No facility-administered medications prior to visit.     Allergies  Allergen Reactions  . Zithromax [Azithromycin Dihydrate]     Nausea & vomiting    ROS As per HPI  PE: Blood pressure 114/75, pulse (!) 54, temperature 98.2 F (36.8 C), temperature source Temporal, resp. rate 16, height 5\' 8"  (1.727 m), weight 150 lb 3.2 oz (68.1 kg), SpO2 96 %. Gen: Alert, well appearing.  Patient is oriented to person, place, time, and situation. AFFECT: pleasant, lucid thought and speech. Back and shoulders without skin abnormality.  No visible or palpable soft tissue mass/nodule or asymmetry. Scapula with normal motion.   LABS:    Chemistry      Component Value Date/Time   NA 142 01/05/2019 0836   K 4.4 01/05/2019 0836   CL 105 01/05/2019 0836   CO2 30 01/05/2019  0836   BUN 23 01/05/2019 0836   CREATININE 0.88 01/05/2019 0836      Component Value Date/Time   CALCIUM 9.5 01/05/2019 0836   ALKPHOS 62 01/05/2019 0836   AST 26 01/05/2019 0836   ALT 19 01/05/2019 0836   BILITOT 0.9 01/05/2019 0836      IMPRESSION AND PLAN:  Left scapula region pain w/inspiration. Exam reassuring. ? Mild pleurisy/pleuritis ? Observation for now, call/return if not signif improved/resolved in 2 wks.  An After Visit Summary was printed and given to the patient.  FOLLOW UP: Return if symptoms worsen or fail to improve.  Signed:  Santiago Bumpers, MD           07/16/2019

## 2019-07-23 DIAGNOSIS — J3081 Allergic rhinitis due to animal (cat) (dog) hair and dander: Secondary | ICD-10-CM | POA: Diagnosis not present

## 2019-07-23 DIAGNOSIS — J3089 Other allergic rhinitis: Secondary | ICD-10-CM | POA: Diagnosis not present

## 2019-07-23 DIAGNOSIS — J301 Allergic rhinitis due to pollen: Secondary | ICD-10-CM | POA: Diagnosis not present

## 2019-07-29 ENCOUNTER — Telehealth: Payer: Self-pay | Admitting: Family Medicine

## 2019-07-29 DIAGNOSIS — M898X1 Other specified disorders of bone, shoulder: Secondary | ICD-10-CM

## 2019-07-29 NOTE — Telephone Encounter (Signed)
Patient advised. He would like to go to [facility] for imaging

## 2019-07-29 NOTE — Telephone Encounter (Signed)
Pt was made aware XR orders will be entered for [facility].

## 2019-07-29 NOTE — Telephone Encounter (Signed)
LM for pt to returncall

## 2019-07-29 NOTE — Telephone Encounter (Signed)
Patient not getting any better, he states he is actually getting worse. Pain in back when breathing in.  Dr. Anitra Lauth told him to call back if symptoms did not improve.  Patient can be reached at [phone]

## 2019-07-29 NOTE — Telephone Encounter (Signed)
No o/v needed at this time. I am going to order a chest x-ray and an x-ray of his scapula. I'll also refer him to a sports medicine doctor when I get x-ray results back. What imaging location?

## 2019-07-29 NOTE — Telephone Encounter (Signed)
OK, x-rays ordered.

## 2019-07-29 NOTE — Telephone Encounter (Signed)
Patient had a visit on 07/16/19 for shoulder pain and told to call/return if not signif improved/resolved in 2 wks.  Would you like to see him in office for re-evaluation? Please advise, thanks.

## 2019-08-02 ENCOUNTER — Ambulatory Visit (HOSPITAL_BASED_OUTPATIENT_CLINIC_OR_DEPARTMENT_OTHER)
Admission: RE | Admit: 2019-08-02 | Discharge: 2019-08-02 | Disposition: A | Discharge status: Home / Self Care | Payer: BC Managed Care – PPO | Source: Ambulatory Visit | Attending: Family Medicine | Admitting: Family Medicine

## 2019-08-02 ENCOUNTER — Other Ambulatory Visit: Payer: Self-pay | Admitting: Family Medicine

## 2019-08-02 ENCOUNTER — Other Ambulatory Visit: Payer: Self-pay

## 2019-08-02 DIAGNOSIS — M898X1 Other specified disorders of bone, shoulder: Secondary | ICD-10-CM | POA: Diagnosis not present

## 2019-08-02 DIAGNOSIS — R079 Chest pain, unspecified: Secondary | ICD-10-CM | POA: Diagnosis not present

## 2019-08-02 DIAGNOSIS — M25512 Pain in left shoulder: Secondary | ICD-10-CM | POA: Diagnosis not present

## 2019-08-03 ENCOUNTER — Other Ambulatory Visit: Payer: Self-pay | Admitting: Family Medicine

## 2019-08-03 DIAGNOSIS — M898X1 Other specified disorders of bone, shoulder: Secondary | ICD-10-CM

## 2019-08-04 DIAGNOSIS — J3081 Allergic rhinitis due to animal (cat) (dog) hair and dander: Secondary | ICD-10-CM | POA: Diagnosis not present

## 2019-08-04 DIAGNOSIS — J301 Allergic rhinitis due to pollen: Secondary | ICD-10-CM | POA: Diagnosis not present

## 2019-08-04 DIAGNOSIS — J3089 Other allergic rhinitis: Secondary | ICD-10-CM | POA: Diagnosis not present

## 2019-08-11 DIAGNOSIS — J3081 Allergic rhinitis due to animal (cat) (dog) hair and dander: Secondary | ICD-10-CM | POA: Diagnosis not present

## 2019-08-11 DIAGNOSIS — J3089 Other allergic rhinitis: Secondary | ICD-10-CM | POA: Diagnosis not present

## 2019-08-11 DIAGNOSIS — J301 Allergic rhinitis due to pollen: Secondary | ICD-10-CM | POA: Diagnosis not present

## 2019-08-16 ENCOUNTER — Encounter: Payer: Self-pay | Admitting: Family Medicine

## 2019-08-16 ENCOUNTER — Ambulatory Visit (HOSPITAL_BASED_OUTPATIENT_CLINIC_OR_DEPARTMENT_OTHER)
Admission: RE | Admit: 2019-08-16 | Discharge: 2019-08-16 | Disposition: A | Discharge status: Home / Self Care | Payer: BC Managed Care – PPO | Source: Ambulatory Visit | Attending: Family Medicine | Admitting: Family Medicine

## 2019-08-16 ENCOUNTER — Ambulatory Visit: Payer: BC Managed Care – PPO | Admitting: Family Medicine

## 2019-08-16 ENCOUNTER — Other Ambulatory Visit: Payer: Self-pay

## 2019-08-16 VITALS — BP 122/79 | HR 67 | Ht 69.0 in | Wt 145.0 lb

## 2019-08-16 DIAGNOSIS — M546 Pain in thoracic spine: Secondary | ICD-10-CM | POA: Diagnosis not present

## 2019-08-16 DIAGNOSIS — M5412 Radiculopathy, cervical region: Secondary | ICD-10-CM | POA: Insufficient documentation

## 2019-08-16 DIAGNOSIS — M5414 Radiculopathy, thoracic region: Secondary | ICD-10-CM | POA: Insufficient documentation

## 2019-08-16 MED ORDER — PREDNISONE 5 MG PO TABS: ORAL_TABLET | ORAL | 0 refills | Status: DC

## 2019-08-16 NOTE — Assessment & Plan Note (Signed)
His pain does not seem to be muscular.  Could be nerve related as almost like a postherpetic neuralgia.  Imaging is not been revealing thus far.  History of colon cancer and pancreatic cancer.  No history of tobacco use. -Thoracic x-rays. -Prednisone. -If no improvement need to consider gabapentin or further imaging.

## 2019-08-16 NOTE — Patient Instructions (Signed)
Nice to meet you Please try salon pas or Aspercreme with lidocaine over the area. I will call with results from today. Please send me a message in MyChart with any questions or updates.  Please see me back in 4 weeks.   --Dr. Jordan Likes

## 2019-08-16 NOTE — Progress Notes (Signed)
Travis Reeves - 49 y.o. male MRN [id number]  Date of birth: [birth year]  SUBJECTIVE:  Including CC & ROS.  Chief Complaint  Patient presents with  . Shoulder Pain    left scapula    Travis Reeves is a 49 y.o. male that is presenting with left-sided thoracic pain.  This pain is been ongoing since Thanksgiving.  He denies any inciting event or trauma.  The pain is worse with deep inhalation.  He is an avid runner and does not endorse the pain with running.  The pain seems to be localized to this paraspinal region just lateral to the mid thoracic spine.  Seems to be occurring just medial to the medial border of the left scapula.  He had an esophageal stretching procedure done in September.  Family history for pancreatic and colon cancer.  No significant history of smoking.  Independent review of the chest x-ray from 12/21 shows no acute abnormality.  Independent review of the scapula x-ray from 12/21 shows no changes.   Review of Systems See HPI   HISTORY: Past Medical, Surgical, Social, and Family History Reviewed & Updated per EMR.   Pertinent Historical Findings include:  Past Medical History:  Diagnosis Date  . Allergic rhinitis    Pollen,cat and dog dander, chron allerg conjunctivitis (Dr. Lucianne Lei Winkle)--gets allergy immunotherapy injections.  . Anxiety    PMH of  . BPH with obstruction/lower urinary tract symptoms 12/2017   doing well on alfuzosin as of 04/2019 urol f/u  . Contusion of left ankle 04/2018   Xray nl.  Reassured by Ortho (Dr. French Ana)  . Eosinophilic esophagitis 60/05/9322   Esoph dilation, started PPI 01/2019.  . Mild intermittent asthma     Past Surgical History:  Procedure Laterality Date  . ESOPHAGOGASTRODUODENOSCOPY  55/73/2202   Eosinophilic esophagitis->esoph dilation  . VASECTOMY  07/2008   Dr. Gaynelle Arabian    Allergies  Allergen Reactions  . Zithromax [Azithromycin Dihydrate]     Nausea & vomiting    Family History  Problem Relation Age of  Onset  . Hypertension Mother   . Hyperlipidemia Mother   . Diabetes Mother   . Migraines Mother   . Pancreatic cancer Mother        died 36  . Depression Father   . Arthritis Father   . Hypertension Father   . Hyperlipidemia Father   . Heart attack Father        > 48; S/P 2 stents  . Diabetes Father   . Colon polyps Father   . Cancer Paternal Grandmother        colon & stomach  . Transient ischemic attack Paternal Grandfather   . Breast cancer Paternal Aunt   . Allergy (severe) Son        on shots  . Esophageal cancer Neg Hx      Social History   Socioeconomic History  . Marital status: Married    Spouse name: Not on file  . Number of children: 2  . Years of education: 18  . Highest education level: Not on file  Occupational History  . Occupation: Press photographer Rep  Tobacco Use  . Smoking status: Never Smoker  . Smokeless tobacco: Never Used  Substance and Sexual Activity  . Alcohol use: Yes    Comment:  occasionally  . Drug use: No  . Sexual activity: Yes    Birth control/protection: Surgical  Other Topics Concern  . Not on file  Social History Narrative   Married,  2 boys.   Educ: Bachelors in agricultural business.   Occup: Scientist, water quality.   No tob, some weekend alcohol.  No drugs.   Fun: Golf and exercise (running).   Social Determinants of Health   Financial Resource Strain:   . Difficulty of Paying Living Expenses: Not on file  Food Insecurity:   . Worried About Programme researcher, broadcasting/film/video in the Last Year: Not on file  . Ran Out of Food in the Last Year: Not on file  Transportation Needs:   . Lack of Transportation (Medical): Not on file  . Lack of Transportation (Non-Medical): Not on file  Physical Activity:   . Days of Exercise per Week: Not on file  . Minutes of Exercise per Session: Not on file  Stress:   . Feeling of Stress : Not on file  Social Connections:   . Frequency of Communication with Friends and Family: Not on file  . Frequency of Social  Gatherings with Friends and Family: Not on file  . Attends Religious Services: Not on file  . Active Member of Clubs or Organizations: Not on file  . Attends Banker Meetings: Not on file  . Marital Status: Not on file  Intimate Partner Violence:   . Fear of Current or Ex-Partner: Not on file  . Emotionally Abused: Not on file  . Physically Abused: Not on file  . Sexually Abused: Not on file     PHYSICAL EXAM:  VS: BP 122/79   Pulse 67   Ht 5\' 9"  (1.753 m)   Wt 145 lb (65.8 kg)   BMI 21.41 kg/m  Physical Exam Gen: NAD, alert, cooperative with exam, well-appearing ENT: normal lips, normal nasal mucosa,  Eye: normal EOM, normal conjunctiva and lids CV:  no edema, +2 pedal pulses   Resp: no accessory muscle use, non-labored,  Skin: no rashes, no areas of induration  Neuro: normal tone, normal sensation to touch Psych:  normal insight, alert and oriented MSK:  Left scapula/back:  No swelling. No winging of the scapula. No tenderness to palpation over the rhomboids on the medial border of the scapula. No tenderness palpation over the midline thoracic spine.  Normal shoulder range of motion Normal strength resistance. Normal flexion and extension. Neurovascularly intact    ASSESSMENT & PLAN:   Acute left-sided thoracic back pain His pain does not seem to be muscular.  Could be nerve related as almost like a postherpetic neuralgia.  Imaging is not been revealing thus far.  History of colon cancer and pancreatic cancer.  No history of tobacco use. -Thoracic x-rays. -Prednisone. -If no improvement need to consider gabapentin or further imaging.

## 2019-08-17 ENCOUNTER — Telehealth: Payer: Self-pay | Admitting: Family Medicine

## 2019-08-17 NOTE — Telephone Encounter (Signed)
Spoke with patient about results.   Myra Rude, MD [facility] 08/17/2019, 2:43 PM

## 2019-08-20 DIAGNOSIS — J3089 Other allergic rhinitis: Secondary | ICD-10-CM | POA: Diagnosis not present

## 2019-08-20 DIAGNOSIS — J3081 Allergic rhinitis due to animal (cat) (dog) hair and dander: Secondary | ICD-10-CM | POA: Diagnosis not present

## 2019-09-08 ENCOUNTER — Other Ambulatory Visit: Payer: Self-pay

## 2019-09-08 ENCOUNTER — Encounter: Payer: Self-pay | Admitting: Family Medicine

## 2019-09-08 ENCOUNTER — Ambulatory Visit: Payer: BC Managed Care – PPO | Admitting: Family Medicine

## 2019-09-08 VITALS — BP 110/78 | HR 64 | Temp 98.2°F | Ht 69.0 in | Wt 151.0 lb

## 2019-09-08 DIAGNOSIS — R202 Paresthesia of skin: Secondary | ICD-10-CM

## 2019-09-08 DIAGNOSIS — M546 Pain in thoracic spine: Secondary | ICD-10-CM

## 2019-09-08 NOTE — Progress Notes (Signed)
OFFICE VISIT  09/08/2019   CC:  Chief Complaint  Patient presents with  . Back Pain  . tingling in legs   HPI:    Patient is a 49 y.o. Caucasian male who presents for ongoing mid back pain on left side.  Achy, not sharp, 2/10 intensity.  Going on for a couple months now, focal pain along medial aspect of L scapula that he ONLY feels when he takes a deep breath when in a slumped over position.  If he sits upright with good posture he doesn't have any pain with deep breath or otherwise.  No radiation of the pain.  No hx of rash.  No preceding viral illness. No SOB or cough or fevers.  No abnl wt loss.   The last 4-6 wks he has noticed a vague "prickly" sensation that goes down legs and arms and also randomly over trunk and even head/scalp at times.  It comes and goes throughout his day, seems to get worse as the day goes on.  No weakness of arms or legs.  No loss of b/c control.  No numbness anywhere.   No neck pain or low back pain.  Past Medical History:  Diagnosis Date  . Allergic rhinitis    Pollen,cat and dog dander, chron allerg conjunctivitis (Dr. Zenaida Niece Winkle)--gets allergy immunotherapy injections.  . Anxiety    PMH of  . BPH with obstruction/lower urinary tract symptoms 12/2017   doing well on alfuzosin as of 04/2019 urol f/u  . Contusion of left ankle 04/2018   Xray nl.  Reassured by Ortho (Dr. Madelon Lips)  . Eosinophilic esophagitis 01/13/2019   Esoph dilation, started PPI 01/2019.  . Mild intermittent asthma     Past Surgical History:  Procedure Laterality Date  . ESOPHAGOGASTRODUODENOSCOPY  01/29/2019   Eosinophilic esophagitis->esoph dilation  . VASECTOMY  07/2008   Dr. Patsi Sears    Outpatient Medications Prior to Visit  Medication Sig Dispense Refill  . albuterol (PROVENTIL HFA) 108 (90 Base) MCG/ACT inhaler Inhale 2 puffs into the lungs every 6 (six) hours as needed for wheezing or shortness of breath. 1 Inhaler 1  . alfuzosin (UROXATRAL) 10 MG 24 hr tablet Take 10  mg by mouth daily. Take 1 daily.    . cetirizine (ZYRTEC) 10 MG tablet Take 10 mg by mouth daily.    Marland Kitchen EPINEPHrine 0.3 mg/0.3 mL IJ SOAJ injection INJ UTD  1  . esomeprazole (NEXIUM) 40 MG capsule Take 1 capsule (40 mg total) by mouth daily before breakfast. 90 capsule 3  . fluticasone (FLONASE) 50 MCG/ACT nasal spray Place 1 spray into both nostrils daily. Must see new provider for future refills 16 g 0  . ibuprofen (ADVIL,MOTRIN) 200 MG tablet Take 200 mg by mouth every 6 (six) hours as needed.      Marland Kitchen levocetirizine (XYZAL) 5 MG tablet     . predniSONE (DELTASONE) 5 MG tablet Take 6 pills for first day, 5 pills second day, 4 pills third day, 3 pills fourth day, 2 pills the fifth day, and 1 pill sixth day. 21 tablet 0   No facility-administered medications prior to visit.    Allergies  Allergen Reactions  . Zithromax [Azithromycin Dihydrate]     Nausea & vomiting    ROS As per HPI  PE: Blood pressure 110/78, pulse 64, temperature 98.2 F (36.8 C), temperature source Oral, height 5\' 9"  (1.753 m), weight 151 lb (68.5 kg), SpO2 98 %. Body mass index is 22.3 kg/m.  Gen: Alert,  well appearing.  Patient is oriented to person, place, time, and situation. AFFECT: pleasant, lucid thought and speech. Back: no midline or para spinous tenderness, no tenderness over scapulae or traps or neck. No rash.  He localizes the area of his pain in a golf ball sized area at inferomedial aspect of L scapula.   LABS:    Chemistry      Component Value Date/Time   NA 142 01/05/2019 0836   K 4.4 01/05/2019 0836   CL 105 01/05/2019 0836   CO2 30 01/05/2019 0836   BUN 23 01/05/2019 0836   CREATININE 0.88 01/05/2019 0836      Component Value Date/Time   CALCIUM 9.5 01/05/2019 0836   ALKPHOS 62 01/05/2019 0836   AST 26 01/05/2019 0836   ALT 19 01/05/2019 0836   BILITOT 0.9 01/05/2019 0836     Lab Results  Component Value Date   WBC 4.7 01/05/2019   HGB 14.2 01/05/2019   HCT 42.0 01/05/2019    MCV 87.2 01/05/2019   PLT 177.0 01/05/2019    IMPRESSION AND PLAN:  1) Thoracic back pain, left side. Unclear etiology.  Imaging has been reassuring (chest xray and T spin plain films). I discussed further diagnostic imaging with MRI of T spine vs going back to Dr. Raeford Razor in sports medicine to get rechecked.  He says he does have appt for f/u with Dr. Raeford Razor so we'll hold off on any imaging right now. I don't think his pain is neuropathy, so I'll hold off on trial of gabapentin.  2) Paresthesias, widespread: unknown etiology. I don't think it is related to his back issue, but I admitted I did not know what was causing this. We discussed neuro referral in future if it does not go away or if it is getting worse.  He expressed understanding and was agreeable to obs approach at this time.  Spent 30 min with pt today, with >50% of this time spent in counseling and care coordination regarding the above problems.  An After Visit Summary was printed and given to the patient.  FOLLOW UP: No follow-ups on file.  Signed:  Crissie Sickles, MD           09/08/2019

## 2019-09-10 DIAGNOSIS — J3081 Allergic rhinitis due to animal (cat) (dog) hair and dander: Secondary | ICD-10-CM | POA: Diagnosis not present

## 2019-09-10 DIAGNOSIS — J3089 Other allergic rhinitis: Secondary | ICD-10-CM | POA: Diagnosis not present

## 2019-09-13 ENCOUNTER — Ambulatory Visit: Payer: BC Managed Care – PPO | Admitting: Family Medicine

## 2019-09-13 ENCOUNTER — Other Ambulatory Visit: Payer: Self-pay

## 2019-09-13 ENCOUNTER — Encounter: Payer: Self-pay | Admitting: Family Medicine

## 2019-09-13 VITALS — BP 127/81 | HR 56 | Ht 69.0 in | Wt 145.0 lb

## 2019-09-13 DIAGNOSIS — N5082 Scrotal pain: Secondary | ICD-10-CM | POA: Insufficient documentation

## 2019-09-13 DIAGNOSIS — M546 Pain in thoracic spine: Secondary | ICD-10-CM | POA: Diagnosis not present

## 2019-09-13 DIAGNOSIS — R300 Dysuria: Secondary | ICD-10-CM | POA: Insufficient documentation

## 2019-09-13 MED ORDER — GABAPENTIN 300 MG PO CAPS: 300.0000 mg | ORAL_CAPSULE | Freq: Three times a day (TID) | ORAL | 0 refills | Status: DC

## 2019-09-13 NOTE — Assessment & Plan Note (Signed)
Unclear if this is related to his back pain or is independent.  No abnormalities on exam.  Unclear if this is related to his dysuria as well. -Checking a UA. -May need to consider an ultrasound if worsening.

## 2019-09-13 NOTE — Patient Instructions (Signed)
Good to see you Please start the gabapentin. Start with one pill for three days, increase to two pills for three days and then up to three pills until its been about over two weeks. Let me know how you feel  I will call with the results   Please send me a message in MyChart with any questions or updates.  Please see me back in 6 weeks.   --Dr. Jordan Likes

## 2019-09-13 NOTE — Assessment & Plan Note (Signed)
Having pain but also having areas of pruritus.  Unclear if this is more of an allergic component or nerve.  Does not seem to have a specific trigger in terms of posterior.  Little improvement with the prednisone. -Initiate gabapentin for couple of weeks.  If symptoms are still ongoing can consider Singulair as the next trial. -Could consider imaging of the thoracic and lumbar spine with MRI to evaluate for any nerve impingement

## 2019-09-13 NOTE — Progress Notes (Signed)
Travis Reeves - 49 y.o. male MRN [id number]  Date of birth: 30-May-1971  SUBJECTIVE:  Including CC & ROS.  Chief Complaint  Patient presents with  . Follow-up    follow up for left-sided low back    Travis Reeves is a 49 y.o. male that is presenting with ongoing thoracic sided pain but also itching.  He has these experiences in the thoracic region that is to the inferior border the scapula is where his intermittently in different areas of his body.  It is usually occurring in the back or trunk as well as the legs.  He does not seem to notice it with anything specifically.  He also endorses some pain with urination which is fairly new.  Denies any trauma.  Having some changes in the scrotal region in terms of pain.  Denies any history of infection or surgery..   Review of Systems See HPI   HISTORY: Past Medical, Surgical, Social, and Family History Reviewed & Updated per EMR.   Pertinent Historical Findings include:  Past Medical History:  Diagnosis Date  . Allergic rhinitis    Pollen,cat and dog dander, chron allerg conjunctivitis (Dr. Lucianne Lei Winkle)--gets allergy immunotherapy injections.  . Anxiety    PMH of  . BPH with obstruction/lower urinary tract symptoms 12/2017   doing well on alfuzosin as of 04/2019 urol f/u  . Contusion of left ankle 04/2018   Xray nl.  Reassured by Ortho (Dr. French Ana)  . Eosinophilic esophagitis 41/74/0814   Esoph dilation, started PPI 01/2019.  . Mild intermittent asthma     Past Surgical History:  Procedure Laterality Date  . ESOPHAGOGASTRODUODENOSCOPY  48/18/5631   Eosinophilic esophagitis->esoph dilation  . VASECTOMY  07/2008   Dr. Gaynelle Arabian    Allergies  Allergen Reactions  . Zithromax [Azithromycin Dihydrate]     Nausea & vomiting    Family History  Problem Relation Age of Onset  . Hypertension Mother   . Hyperlipidemia Mother   . Diabetes Mother   . Migraines Mother   . Pancreatic cancer Mother        died 37  .  Depression Father   . Arthritis Father   . Hypertension Father   . Hyperlipidemia Father   . Heart attack Father        > 82; S/P 2 stents  . Diabetes Father   . Colon polyps Father   . Cancer Paternal Grandmother        colon & stomach  . Transient ischemic attack Paternal Grandfather   . Breast cancer Paternal Aunt   . Allergy (severe) Son        on shots  . Esophageal cancer Neg Hx      Social History   Socioeconomic History  . Marital status: Married    Spouse name: Not on file  . Number of children: 2  . Years of education: 25  . Highest education level: Not on file  Occupational History  . Occupation: Press photographer Rep  Tobacco Use  . Smoking status: Never Smoker  . Smokeless tobacco: Never Used  Substance and Sexual Activity  . Alcohol use: Yes    Comment:  occasionally  . Drug use: No  . Sexual activity: Yes    Birth control/protection: Surgical  Other Topics Concern  . Not on file  Social History Narrative   Married, 2 boys.   Educ: Bachelors in agricultural business.   Occup: Passenger transport manager.   No tob, some weekend alcohol.  No drugs.  Fun: Golf and exercise (running).   Social Determinants of Health   Financial Resource Strain:   . Difficulty of Paying Living Expenses: Not on file  Food Insecurity:   . Worried About Programme researcher, broadcasting/film/video in the Last Year: Not on file  . Ran Out of Food in the Last Year: Not on file  Transportation Needs:   . Lack of Transportation (Medical): Not on file  . Lack of Transportation (Non-Medical): Not on file  Physical Activity:   . Days of Exercise per Week: Not on file  . Minutes of Exercise per Session: Not on file  Stress:   . Feeling of Stress : Not on file  Social Connections:   . Frequency of Communication with Friends and Family: Not on file  . Frequency of Social Gatherings with Friends and Family: Not on file  . Attends Religious Services: Not on file  . Active Member of Clubs or Organizations: Not on file  .  Attends Banker Meetings: Not on file  . Marital Status: Not on file  Intimate Partner Violence:   . Fear of Current or Ex-Partner: Not on file  . Emotionally Abused: Not on file  . Physically Abused: Not on file  . Sexually Abused: Not on file     PHYSICAL EXAM:  VS: BP 127/81   Pulse (!) 56   Ht 5\' 9"  (1.753 m)   Wt 145 lb (65.8 kg)   BMI 21.41 kg/m  Physical Exam Gen: NAD, alert, cooperative with exam, well-appearing ENT: normal lips, normal nasal mucosa,  Eye: normal EOM, normal conjunctiva and lids Skin: no rashes, no areas of induration  Neuro: normal tone, normal sensation to touch Psych:  normal insight, alert and oriented MSK:  Back: Normal range of motion. No signs of ecchymosis or swelling. Normal strength resistance. GU: No hernia appreciated. No inguinal lymphadenopathy. Normal cremasteric reflex. Neurovascularly intact     ASSESSMENT & PLAN:   Acute left-sided thoracic back pain Having pain but also having areas of pruritus.  Unclear if this is more of an allergic component or nerve.  Does not seem to have a specific trigger in terms of posterior.  Little improvement with the prednisone. -Initiate gabapentin for couple of weeks.  If symptoms are still ongoing can consider Singulair as the next trial. -Could consider imaging of the thoracic and lumbar spine with MRI to evaluate for any nerve impingement  Dysuria New onset.  No history of similar symptoms. -Urinalysis.  Scrotal pain Unclear if this is related to his back pain or is independent.  No abnormalities on exam.  Unclear if this is related to his dysuria as well. -Checking a UA. -May need to consider an ultrasound if worsening.

## 2019-09-13 NOTE — Assessment & Plan Note (Signed)
New onset.  No history of similar symptoms. -Urinalysis.

## 2019-09-14 ENCOUNTER — Telehealth: Payer: Self-pay | Admitting: Family Medicine

## 2019-09-14 LAB — URINALYSIS
Bilirubin, UA: NEGATIVE
Glucose, UA: NEGATIVE
Ketones, UA: NEGATIVE
Leukocytes,UA: NEGATIVE
Nitrite, UA: NEGATIVE
Protein,UA: NEGATIVE
RBC, UA: NEGATIVE
Specific Gravity, UA: 1.021 (ref 1.005–1.030)
Urobilinogen, Ur: 0.2 mg/dL (ref 0.2–1.0)
pH, UA: 6 (ref 5.0–7.5)

## 2019-09-14 NOTE — Telephone Encounter (Signed)
Informed of results.   Myra Rude, MD [facility] 09/14/2019, 8:13 AM

## 2019-09-17 DIAGNOSIS — J3089 Other allergic rhinitis: Secondary | ICD-10-CM | POA: Diagnosis not present

## 2019-09-17 DIAGNOSIS — J301 Allergic rhinitis due to pollen: Secondary | ICD-10-CM | POA: Diagnosis not present

## 2019-09-17 DIAGNOSIS — J3081 Allergic rhinitis due to animal (cat) (dog) hair and dander: Secondary | ICD-10-CM | POA: Diagnosis not present

## 2019-09-22 DIAGNOSIS — M9903 Segmental and somatic dysfunction of lumbar region: Secondary | ICD-10-CM | POA: Diagnosis not present

## 2019-09-22 DIAGNOSIS — M9902 Segmental and somatic dysfunction of thoracic region: Secondary | ICD-10-CM | POA: Diagnosis not present

## 2019-09-22 DIAGNOSIS — M62830 Muscle spasm of back: Secondary | ICD-10-CM | POA: Diagnosis not present

## 2019-09-22 DIAGNOSIS — M9905 Segmental and somatic dysfunction of pelvic region: Secondary | ICD-10-CM | POA: Diagnosis not present

## 2019-09-24 ENCOUNTER — Other Ambulatory Visit: Payer: Self-pay | Admitting: Family Medicine

## 2019-09-24 ENCOUNTER — Encounter: Payer: Self-pay | Admitting: Family Medicine

## 2019-09-24 DIAGNOSIS — R0781 Pleurodynia: Secondary | ICD-10-CM

## 2019-09-24 DIAGNOSIS — J301 Allergic rhinitis due to pollen: Secondary | ICD-10-CM | POA: Diagnosis not present

## 2019-09-24 DIAGNOSIS — J3081 Allergic rhinitis due to animal (cat) (dog) hair and dander: Secondary | ICD-10-CM | POA: Diagnosis not present

## 2019-09-24 DIAGNOSIS — J3089 Other allergic rhinitis: Secondary | ICD-10-CM | POA: Diagnosis not present

## 2019-09-27 DIAGNOSIS — M9902 Segmental and somatic dysfunction of thoracic region: Secondary | ICD-10-CM | POA: Diagnosis not present

## 2019-09-27 DIAGNOSIS — M9903 Segmental and somatic dysfunction of lumbar region: Secondary | ICD-10-CM | POA: Diagnosis not present

## 2019-09-27 DIAGNOSIS — M9905 Segmental and somatic dysfunction of pelvic region: Secondary | ICD-10-CM | POA: Diagnosis not present

## 2019-09-27 DIAGNOSIS — M62830 Muscle spasm of back: Secondary | ICD-10-CM | POA: Diagnosis not present

## 2019-09-28 ENCOUNTER — Telehealth: Payer: Self-pay | Admitting: Family Medicine

## 2019-09-28 DIAGNOSIS — R0781 Pleurodynia: Secondary | ICD-10-CM

## 2019-09-28 LAB — CK: Total CK: 98 U/L (ref 49–439)

## 2019-09-28 LAB — ANA,IFA RA DIAG PNL W/RFLX TIT/PATN
ANA Titer 1: NEGATIVE
Cyclic Citrullin Peptide Ab: 7 units (ref 0–19)
Rheumatoid fact SerPl-aCnc: <10 IU/mL (ref 0.0–13.9)

## 2019-09-28 LAB — D-DIMER, QUANTITATIVE: D-DIMER: <0.2 mg/L FEU (ref 0.00–0.49)

## 2019-09-28 LAB — URIC ACID: Uric Acid: 4.4 mg/dL (ref 3.8–8.4)

## 2019-09-28 LAB — SEDIMENTATION RATE: Sed Rate: 2 mm/hr (ref 0–15)

## 2019-09-28 LAB — C-REACTIVE PROTEIN: CRP: <1 mg/L (ref 0–10)

## 2019-09-28 NOTE — Telephone Encounter (Signed)
Spoke with patient about results.  Having ongoing pleuritic type chest pain and an elevated heart rate while running which is new for him.  Referral to cardiology for possible exercises tolerance test and ECHO.   Myra Rude, MD [facility] 09/28/2019, 10:20 AM

## 2019-09-29 ENCOUNTER — Telehealth: Payer: Self-pay

## 2019-09-29 NOTE — Telephone Encounter (Signed)
Called pt to change appointment to virtual or reschedule. Left message to call back.  

## 2019-09-30 ENCOUNTER — Ambulatory Visit: Payer: BC Managed Care – PPO | Admitting: Cardiology

## 2019-10-04 ENCOUNTER — Other Ambulatory Visit: Payer: Self-pay

## 2019-10-04 ENCOUNTER — Ambulatory Visit: Payer: BC Managed Care – PPO | Admitting: Cardiology

## 2019-10-04 ENCOUNTER — Encounter: Payer: Self-pay | Admitting: *Deleted

## 2019-10-04 ENCOUNTER — Encounter: Payer: Self-pay | Admitting: Cardiology

## 2019-10-04 VITALS — BP 123/79 | HR 58 | Temp 97.2°F | Ht 69.0 in | Wt 155.0 lb

## 2019-10-04 DIAGNOSIS — R072 Precordial pain: Secondary | ICD-10-CM | POA: Diagnosis not present

## 2019-10-04 DIAGNOSIS — M9903 Segmental and somatic dysfunction of lumbar region: Secondary | ICD-10-CM | POA: Diagnosis not present

## 2019-10-04 DIAGNOSIS — M62830 Muscle spasm of back: Secondary | ICD-10-CM | POA: Diagnosis not present

## 2019-10-04 DIAGNOSIS — Z8249 Family history of ischemic heart disease and other diseases of the circulatory system: Secondary | ICD-10-CM | POA: Diagnosis not present

## 2019-10-04 DIAGNOSIS — Z7189 Other specified counseling: Secondary | ICD-10-CM

## 2019-10-04 DIAGNOSIS — M9905 Segmental and somatic dysfunction of pelvic region: Secondary | ICD-10-CM | POA: Diagnosis not present

## 2019-10-04 DIAGNOSIS — M9902 Segmental and somatic dysfunction of thoracic region: Secondary | ICD-10-CM | POA: Diagnosis not present

## 2019-10-04 DIAGNOSIS — R000 Tachycardia, unspecified: Secondary | ICD-10-CM | POA: Diagnosis not present

## 2019-10-04 NOTE — Patient Instructions (Signed)
Medication Instructions:  Your Physician recommend you continue on your current medication as directed.    *If you need a refill on your cardiac medications before your next appointment, please call your pharmacy*  Lab Work: None  Testing/Procedures: Our physician has recommended that you wear an 7  DAY ZIO-PATCH monitor. The Zio patch cardiac monitor continuously records heart rhythm data for up to 14 days, this is for patients being evaluated for multiple types heart rhythms. For the first 24 hours post application, please avoid getting the Zio monitor wet in the shower or by excessive sweating during exercise. After that, feel free to carry on with regular activities. Keep soaps and lotions away from the ZIO XT Patch.   Someone from our office will call to mail monitor.      Follow-Up: At [facility], you and your health needs are our priority.  As part of our continuing mission to provide you with exceptional heart care, we have created designated Provider Care Teams.  These Care Teams include your primary Cardiologist (physician) and Advanced Practice Providers (APPs -  Physician Assistants and Nurse Practitioners) who all work together to provide you with the care you need, when you need it.  Your next appointment:   As needed  The format for your next appointment:   Either In Person or Virtual  Provider:   Jodelle Red, MD

## 2019-10-04 NOTE — Progress Notes (Signed)
Patient ID: Travis Reeves, male   DOB: March 10, 1971, 49 y.o.   MRN: [id number] Patient enrolled for [employer] to mail a 7 day ZIO XT long term holter monitor to his home.

## 2019-10-04 NOTE — Progress Notes (Signed)
Cardiology Office Note:    Date:  10/04/2019   ID:  Travis Reeves, DOB [birth year], MRN [id number]  PCP:  Travis Sou, MD  Cardiologist:  Travis Dresser, MD  Referring MD: Travis Sou, MD   CC: new patient consultation for chest pain and tachycardia with running  History of Present Illness:    Travis Reeves is a 49 y.o. male without prior heart history who is seen as a new consult at the request of McGowen, Adrian Blackwater, MD for the evaluation and management of chest pain and tachycardia.  Note from Dr. Raeford Reeves dated 09/13/19 personally reviewed. Reports thoracic pain, described in back beneath shoulder blade but reported in different areas. Also with acute itching. Phone note from 09/28/19 also reviewed, noted pleuritic chest pain and tachycardia when running.  Two weekends ago, was out running, noted HR was up to 190 bpm on his garmin. Over the last few months, has been going up. Ran a 24 hour ultramarathon (97 miles) and heart rate was not over 178 in the fall. Only elevated when he runs. Average resting heart rate 49-51 bpm.   Chest pain: -Initial onset: started around Thanksgiving, diagnosed with pleurisy. Had left thoracic pain since that time.  -Quality: needle prick, harp -Frequency: usually every day -Duration: worse as the day goes on, can be constant -Associated symptoms: prickly/tingling sensation across his body -Aggravating/alleviating factors: better since starting gabapentin. Worse with taking deep breath, stooping over -Prior cardiac history: none -Prior ECG: normal -Prior workup: none -Prior treatment: gabapentin -Alcohol: 3 beers on a weekend -Tobacco: never -Comorbidities: none -Exercise level: extremely active, high mileage runner -Cardiac ROS: no shortness of breath, no PND, no orthopnea, no LE edema, no syncope -Family history: mom and dad both diabetic. Mother deceased, had high blood pressure. Father had 3 stents (in his 73s), has high  blood pressure.   Past Medical History:  Diagnosis Date  . Allergic rhinitis    Pollen,cat and dog dander, chron allerg conjunctivitis (Dr. Lucianne Lei Reeves)--gets allergy immunotherapy injections.  . Anxiety    PMH of  . BPH with obstruction/lower urinary tract symptoms 12/2017   doing well on alfuzosin as of 04/2019 urol f/u  . Contusion of left ankle 04/2018   Xray nl.  Reassured by Ortho (Dr. French Ana)  . Eosinophilic esophagitis 54/65/6812   Esoph dilation, started PPI 01/2019.  . Mild intermittent asthma     Past Surgical History:  Procedure Laterality Date  . ESOPHAGOGASTRODUODENOSCOPY  75/17/0017   Eosinophilic esophagitis->esoph dilation  . VASECTOMY  07/2008   Dr. Gaynelle Reeves    Current Medications: Current Outpatient Medications on File Prior to Visit  Medication Sig  . albuterol (PROVENTIL HFA) 108 (90 Base) MCG/ACT inhaler Inhale 2 puffs into the lungs every 6 (six) hours as needed for wheezing or shortness of breath.  . alfuzosin (UROXATRAL) 10 MG 24 hr tablet Take 10 mg by mouth daily. Take 1 daily.  . cetirizine (ZYRTEC) 10 MG tablet Take 10 mg by mouth daily.  Travis Reeves EPINEPHrine 0.3 mg/0.3 mL IJ SOAJ injection INJ UTD  . esomeprazole (NEXIUM) 40 MG capsule Take 1 capsule (40 mg total) by mouth daily before breakfast.  . fluticasone (FLONASE) 50 MCG/ACT nasal spray Place 1 spray into both nostrils daily. Must see new provider for future refills  . gabapentin (NEURONTIN) 300 MG capsule Take 1 capsule (300 mg total) by mouth 3 (three) times daily.  Travis Reeves ibuprofen (ADVIL,MOTRIN) 200 MG tablet Take 200 mg by mouth every 6 (  six) hours as needed.     No current facility-administered medications on file prior to visit.     Allergies:   Zithromax [azithromycin dihydrate]   Social History   Tobacco Use  . Smoking status: Never Smoker  . Smokeless tobacco: Never Used  Substance Use Topics  . Alcohol use: Yes    Comment:  occasionally  . Drug use: No    Family History: family  history includes Allergy (severe) in his son; Arthritis in his father; Breast cancer in his paternal aunt; Cancer in his paternal grandmother; Colon polyps in his father; Depression in his father; Diabetes in his father and mother; Heart attack in his father; Hyperlipidemia in his father and mother; Hypertension in his father and mother; Migraines in his mother; Pancreatic cancer in his mother; Transient ischemic attack in his paternal grandfather. There is no history of Esophageal cancer.  ROS:   Please see the history of present illness.  Additional pertinent ROS: Constitutional: Negative for chills, fever, night sweats, unintentional weight loss  HENT: Negative for ear pain and hearing loss.   Eyes: Negative for loss of vision and eye pain.  Respiratory: Negative for cough, sputum, wheezing.   Cardiovascular: See HPI. Gastrointestinal: Negative for abdominal pain, melena, and hematochezia.  Genitourinary: Negative for dysuria and hematuria.  Musculoskeletal: Negative for falls and myalgias.  Skin: Negative for itching and rash.  Neurological: Negative for focal weakness, focal sensory changes and loss of consciousness.  Endo/Heme/Allergies: Does not bruise/bleed easily.     EKGs/Labs/Other Studies Reviewed:    The following studies were reviewed today: Recent labs from 09/24/19 personally reviewed. Normal CRP, ESR, CK, d dimer, ANA  EKG:  EKG is personally reviewed.  The ekg ordered today demonstrates sinus bradycardia at 58 bpm, mild j point elevation  Recent Labs: 01/05/2019: ALT 19; BUN 23; Creatinine, Ser 0.88; Hemoglobin 14.2; Platelets 177.0; Potassium 4.4; Sodium 142; TSH 1.52  Recent Lipid Panel    Component Value Date/Time   CHOL 155 01/05/2019 0836   TRIG 50.0 01/05/2019 0836   HDL 61.90 01/05/2019 0836   CHOLHDL 3 01/05/2019 0836   VLDL 10.0 01/05/2019 0836   LDLCALC 83 01/05/2019 0836    Physical Exam:    VS:  BP 123/79   Pulse (!) 58   Temp (!) 97.2 F (36.2 C)    Ht '5\' 9"'  (1.753 m)   Wt 155 lb (70.3 kg)   SpO2 100%   BMI 22.89 kg/m     Wt Readings from Last 3 Encounters:  10/04/19 155 lb (70.3 kg)  09/13/19 145 lb (65.8 kg)  09/08/19 151 lb (68.5 kg)    GEN: Well nourished, well developed in no acute distress HEENT: Normal, moist mucous membranes NECK: No JVD CARDIAC: regular rhythm, normal S1 and S2, no rubs or gallops. No murmurs. VASCULAR: Radial and DP pulses 2+ bilaterally. No carotid bruits RESPIRATORY:  Clear to auscultation without rales, wheezing or rhonchi  ABDOMEN: Soft, non-tender, non-distended MUSCULOSKELETAL:  Ambulates independently SKIN: Warm and dry, no edema NEUROLOGIC:  Alert and oriented x 3. No focal neuro deficits noted. PSYCHIATRIC:  Normal affect    ASSESSMENT:    1. Tachycardia   2. Precordial pain   3. Cardiac risk counseling   4. Family history of heart disease    PLAN:    Chest pain: unlikely to be cardiac. He is exceptionally active and can run long distances without symptoms. His pain is atypical in location, symptoms, triggers, and duration. His ASCVD risk  is very low -no indication for further testing at this time. He far exceeds what we would achieve with a treadmill stress test -counseled on red flag warning signs that need medical attention  Tachycardia: does not occur at rest, only with peak exertion, slows down upon stopping activity. Not bothersome, and he feels he can have a conversation even when HR 190s -discussed that this is likely sinus tachycardia, which does not require treatment -will rule out SVT, as he is concerned about his heart rate -7 day Zio -discussed goal of 220-age as target heart rate. He is asymptomatic, even when HR is rapid. No high risk features. -ECG sinus bradycardia today  Cardiac risk counseling, with family history of heart disease: -his lifestyle is excellent, no additional risk factors -ASCVD risk score: The 10-year ASCVD risk score Mikey Bussing DC Brooke Bonito., et al.,  2013) is: 1.2%   Values used to calculate the score:     Age: 35 years     Sex: Male     Is Non-Hispanic African American: No     Diabetic: No     Tobacco smoker: No     Systolic Blood Pressure: 469 mmHg     Is BP treated: No     HDL Cholesterol: 61.9 mg/dL     Total Cholesterol: 155 mg/dL    Plan for follow up: PRN based on results of monitor  Travis Dresser, MD, PhD [place]  [facility]    Medication Adjustments/Labs and Tests Ordered: Current medicines are reviewed at length with the patient today.  Concerns regarding medicines are outlined above.  Orders Placed This Encounter  Procedures  . LONG TERM MONITOR (3-14 DAYS)  . EKG 12-Lead   No orders of the defined types were placed in this encounter.   Patient Instructions  Medication Instructions:  Your Physician recommend you continue on your current medication as directed.    *If you need a refill on your cardiac medications before your next appointment, please call your pharmacy*  Lab Work: None  Testing/Procedures: Our physician has recommended that you wear an [place] monitor. The Zio patch cardiac monitor continuously records heart rhythm data for up to 14 days, this is for patients being evaluated for multiple types heart rhythms. For the first 24 hours post application, please avoid getting the Zio monitor wet in the shower or by excessive sweating during exercise. After that, feel free to carry on with regular activities. Keep soaps and lotions away from the ZIO XT Patch.   Someone from our office will call to mail monitor.      Follow-Up: At [facility], you and your health needs are our priority.  As part of our continuing mission to provide you with exceptional heart care, we have created designated Provider Care Teams.  These Care Teams include your primary Cardiologist (physician) and Advanced Practice Providers (APPs -  Physician Assistants and Nurse Practitioners) who all work  together to provide you with the care you need, when you need it.  Your next appointment:   As needed  The format for your next appointment:   Either In Person or Virtual  Provider:   Buford Dresser, MD     Signed, Travis Dresser, MD PhD 10/04/2019 9:13 AM    [place]

## 2019-10-07 ENCOUNTER — Other Ambulatory Visit (INDEPENDENT_AMBULATORY_CARE_PROVIDER_SITE_OTHER): Payer: BC Managed Care – PPO

## 2019-10-07 DIAGNOSIS — R000 Tachycardia, unspecified: Secondary | ICD-10-CM | POA: Diagnosis not present

## 2019-10-08 DIAGNOSIS — J301 Allergic rhinitis due to pollen: Secondary | ICD-10-CM | POA: Diagnosis not present

## 2019-10-08 DIAGNOSIS — J3081 Allergic rhinitis due to animal (cat) (dog) hair and dander: Secondary | ICD-10-CM | POA: Diagnosis not present

## 2019-10-08 DIAGNOSIS — J3089 Other allergic rhinitis: Secondary | ICD-10-CM | POA: Diagnosis not present

## 2019-10-11 DIAGNOSIS — M9905 Segmental and somatic dysfunction of pelvic region: Secondary | ICD-10-CM | POA: Diagnosis not present

## 2019-10-11 DIAGNOSIS — M62830 Muscle spasm of back: Secondary | ICD-10-CM | POA: Diagnosis not present

## 2019-10-11 DIAGNOSIS — M9903 Segmental and somatic dysfunction of lumbar region: Secondary | ICD-10-CM | POA: Diagnosis not present

## 2019-10-11 DIAGNOSIS — M9902 Segmental and somatic dysfunction of thoracic region: Secondary | ICD-10-CM | POA: Diagnosis not present

## 2019-10-18 DIAGNOSIS — J3081 Allergic rhinitis due to animal (cat) (dog) hair and dander: Secondary | ICD-10-CM | POA: Diagnosis not present

## 2019-10-18 DIAGNOSIS — J3089 Other allergic rhinitis: Secondary | ICD-10-CM | POA: Diagnosis not present

## 2019-10-18 DIAGNOSIS — J301 Allergic rhinitis due to pollen: Secondary | ICD-10-CM | POA: Diagnosis not present

## 2019-10-20 ENCOUNTER — Encounter: Payer: Self-pay | Admitting: Family Medicine

## 2019-10-20 DIAGNOSIS — R208 Other disturbances of skin sensation: Secondary | ICD-10-CM

## 2019-10-20 DIAGNOSIS — R202 Paresthesia of skin: Secondary | ICD-10-CM

## 2019-10-21 ENCOUNTER — Other Ambulatory Visit: Payer: Self-pay | Admitting: Family Medicine

## 2019-10-21 ENCOUNTER — Encounter: Payer: Self-pay | Admitting: Family Medicine

## 2019-10-21 MED ORDER — GABAPENTIN 300 MG PO CAPS: 300.0000 mg | ORAL_CAPSULE | Freq: Three times a day (TID) | ORAL | 2 refills | Status: DC

## 2019-10-21 NOTE — Telephone Encounter (Signed)
Pt's last visit was 09/08/19. "Discussed neuro referral in future if it does not go away or if it is getting worse. "   Please advise, thanks.

## 2019-10-21 NOTE — Telephone Encounter (Signed)
I ordered referral to neurologist.

## 2019-10-25 ENCOUNTER — Encounter: Payer: Self-pay | Admitting: Family Medicine

## 2019-10-25 ENCOUNTER — Other Ambulatory Visit: Payer: Self-pay

## 2019-10-25 ENCOUNTER — Ambulatory Visit: Payer: BC Managed Care – PPO | Admitting: Family Medicine

## 2019-10-25 VITALS — BP 120/81 | HR 52 | Ht 69.0 in | Wt 145.0 lb

## 2019-10-25 DIAGNOSIS — K200 Eosinophilic esophagitis: Secondary | ICD-10-CM

## 2019-10-25 DIAGNOSIS — R208 Other disturbances of skin sensation: Secondary | ICD-10-CM

## 2019-10-25 DIAGNOSIS — R202 Paresthesia of skin: Secondary | ICD-10-CM | POA: Diagnosis not present

## 2019-10-25 DIAGNOSIS — M546 Pain in thoracic spine: Secondary | ICD-10-CM

## 2019-10-25 NOTE — Patient Instructions (Signed)
Good to see you I will call with the results today   Please send me a message in MyChart with any questions or updates.  We will discuss follow up based on your results.   --Dr. Jordan Likes

## 2019-10-25 NOTE — Assessment & Plan Note (Addendum)
He is doing well since his dilation.  Unclear if his eosinophilia is related to his current symptoms.  He is having irritation of the upper quadrant.  Seems more related to his dysesthesias as opposed to pain -Eosinophil count, tryptase, smear review -Counseled supportive care. - Could consider CT abdomen pelvis.

## 2019-10-25 NOTE — Progress Notes (Signed)
DESTON BILYEU - 49 y.o. male MRN [id number]  Date of birth: October 03, 1970  SUBJECTIVE:  Including CC & ROS.  Chief Complaint  Patient presents with  . Follow-up    follow up for left-sided thoracic back pain    CREEDENCE KUNESH is a 49 y.o. male that is following up for his neck pain, changes in his heart rate with running and altered sensations that he has been having.  He has been to the chiropractor and most of his thoracic back pain has resolved.  He is currently having some dysesthesias and paresthesias as well as mild pruritus.  He is having the sensations intermittently over the course of the day with no specific trigger and in different areas each time.  They randomly occur and will cease on their own.  The sensations were improved with taking gabapentin.  This was recently discontinued as he ran out of the medication and his symptoms seem to be worse.  He has been seen by the cardiologist and it appears that his heart rate changes with running are gone.  He stopped using a heart rate monitor and it seems that he has not noticed any like he was previously.   Review of Systems See HPI   HISTORY: Past Medical, Surgical, Social, and Family History Reviewed & Updated per EMR.   Pertinent Historical Findings include:  Past Medical History:  Diagnosis Date  . Allergic rhinitis    Pollen,cat and dog dander, chron allerg conjunctivitis (Dr. Zenaida Niece Winkle)--gets allergy immunotherapy injections.  . Anxiety    PMH of  . BPH with obstruction/lower urinary tract symptoms 12/2017   doing well on alfuzosin as of 04/2019 urol f/u  . Contusion of left ankle 04/2018   Xray nl.  Reassured by Ortho (Dr. Madelon Lips)  . Eosinophilic esophagitis 01/13/2019   Esoph dilation, started PPI 01/2019.  . Mild intermittent asthma     Past Surgical History:  Procedure Laterality Date  . ESOPHAGOGASTRODUODENOSCOPY  01/29/2019   Eosinophilic esophagitis->esoph dilation  . VASECTOMY  07/2008   Dr. Patsi Sears      Family History  Problem Relation Age of Onset  . Hypertension Mother   . Hyperlipidemia Mother   . Diabetes Mother   . Migraines Mother   . Pancreatic cancer Mother        died 80  . Depression Father   . Arthritis Father   . Hypertension Father   . Hyperlipidemia Father   . Heart attack Father        > 55; S/P 2 stents  . Diabetes Father   . Colon polyps Father   . Cancer Paternal Grandmother        colon & stomach  . Transient ischemic attack Paternal Grandfather   . Breast cancer Paternal Aunt   . Allergy (severe) Son        on shots  . Esophageal cancer Neg Hx     Social History   Socioeconomic History  . Marital status: Married    Spouse name: Not on file  . Number of children: 2  . Years of education: 52  . Highest education level: Not on file  Occupational History  . Occupation: Airline pilot Rep  Tobacco Use  . Smoking status: Never Smoker  . Smokeless tobacco: Never Used  Substance and Sexual Activity  . Alcohol use: Yes    Comment:  occasionally  . Drug use: No  . Sexual activity: Yes    Birth control/protection: Surgical  Other Topics Concern  .  Not on file  Social History Narrative   Married, 2 boys.   Educ: Bachelors in agricultural business.   Occup: Passenger transport manager.   No tob, some weekend alcohol.  No drugs.   Fun: Golf and exercise (running).   Social Determinants of Health   Financial Resource Strain:   . Difficulty of Paying Living Expenses:   Food Insecurity:   . Worried About Charity fundraiser in the Last Year:   . Arboriculturist in the Last Year:   Transportation Needs:   . Film/video editor (Medical):   Marland Kitchen Lack of Transportation (Non-Medical):   Physical Activity:   . Days of Exercise per Week:   . Minutes of Exercise per Session:   Stress:   . Feeling of Stress :   Social Connections:   . Frequency of Communication with Friends and Family:   . Frequency of Social Gatherings with Friends and Family:   . Attends  Religious Services:   . Active Member of Clubs or Organizations:   . Attends Archivist Meetings:   Marland Kitchen Marital Status:   Intimate Partner Violence:   . Fear of Current or Ex-Partner:   . Emotionally Abused:   Marland Kitchen Physically Abused:   . Sexually Abused:      PHYSICAL EXAM:  VS: BP 120/81   Pulse (!) 52   Ht 5\' 9"  (1.753 m)   Wt 145 lb (65.8 kg)   BMI 21.41 kg/m  Physical Exam Gen: NAD, alert, cooperative with exam, well-appearing MSK:  Back:  Normal flexion extension. Normal strength in lower extremities. Normal gait. Normal strength resistance with hip flexion. Normal strength resistance with plantarflexion and dorsiflexion. Neurovascular intact     ASSESSMENT & PLAN:   Eosinophilic esophagitis He is doing well since his dilation.  Unclear if his eosinophilia is related to his current symptoms.  He is having irritation of the upper quadrant.  Seems more related to his dysesthesias as opposed to pain -Eosinophil count, tryptase, smear review -Counseled supportive care. - Could consider CT abdomen pelvis.  Dysesthesia Having the symptoms intermittently through the course of the day.  Did get some improvement with the gabapentin.  It seemed to be worse when this was discontinued as he ran out of the medication.  Possible that his symptoms could be related to gabapentin or abruptly discontinuing it.  His EGD did show eosinophilia although his blood count did not.   -B12 and folate, iron and ferritin, C3 and C4 -If lab work is normal could consider a trial off of gabapentin in a stepwise manner.    Acute left-sided thoracic back pain Has with her chiropractor and this seems to be doing better. -Counseled on home exercise therapy and supportive care.

## 2019-10-25 NOTE — Assessment & Plan Note (Signed)
Has with her chiropractor and this seems to be doing better. -Counseled on home exercise therapy and supportive care.

## 2019-10-25 NOTE — Assessment & Plan Note (Addendum)
Having the symptoms intermittently through the course of the day.  Did get some improvement with the gabapentin.  It seemed to be worse when this was discontinued as he ran out of the medication.  Possible that his symptoms could be related to gabapentin or abruptly discontinuing it.  His EGD did show eosinophilia although his blood count did not.   -B12 and folate, iron and ferritin, C3 and C4 -If lab work is normal could consider a trial off of gabapentin in a stepwise manner.

## 2019-10-26 LAB — IRON,TIBC AND FERRITIN PANEL
Ferritin: 88 ng/mL (ref 30–400)
Iron Saturation: 57 % — ABNORMAL HIGH (ref 15–55)
Iron: 168 ug/dL (ref 38–169)
Total Iron Binding Capacity: 293 ug/dL (ref 250–450)
UIBC: 125 ug/dL (ref 111–343)

## 2019-10-26 LAB — C3 AND C4
Complement C3, Serum: 88 mg/dL (ref 82–167)
Complement C4, Serum: 18 mg/dL (ref 12–38)

## 2019-10-26 LAB — B12 AND FOLATE PANEL
Folate: 14.7 ng/mL (ref >3.0)
Vitamin B-12: 304 pg/mL (ref 232–1245)

## 2019-10-27 ENCOUNTER — Telehealth: Payer: Self-pay | Admitting: Family Medicine

## 2019-10-27 LAB — PATHOLOGIST SMEAR REVIEW
Basophils Absolute: 0 10*3/uL (ref 0.0–0.2)
Basos: 1 %
EOS (ABSOLUTE): 0.1 10*3/uL (ref 0.0–0.4)
Eos: 2 %
Hematocrit: 41.9 % (ref 37.5–51.0)
Hemoglobin: 14 g/dL (ref 13.0–17.7)
Immature Grans (Abs): 0 10*3/uL (ref 0.0–0.1)
Immature Granulocytes: 0 %
Lymphocytes Absolute: 1.2 10*3/uL (ref 0.7–3.1)
Lymphs: 25 %
MCH: 29.4 pg (ref 26.6–33.0)
MCHC: 33.4 g/dL (ref 31.5–35.7)
MCV: 88 fL (ref 79–97)
Monocytes Absolute: 0.5 10*3/uL (ref 0.1–0.9)
Monocytes: 10 %
Neutrophils Absolute: 3.1 10*3/uL (ref 1.4–7.0)
Neutrophils: 62 %
Path Rev PLTs: NORMAL
Path Rev RBC: NORMAL
Path Rev WBC: NORMAL
Platelets: 166 10*3/uL (ref 150–450)
RBC: 4.77 x10E6/uL (ref 4.14–5.80)
RDW: 12.1 % (ref 11.6–15.4)
WBC: 4.9 10*3/uL (ref 3.4–10.8)

## 2019-10-27 LAB — TRYPTASE: Tryptase: 7.1 ug/L (ref 2.2–13.2)

## 2019-10-27 LAB — EOSINOPHIL COUNT: EOS (ABSOLUTE): 0.1 10*3/uL (ref 0.0–0.4)

## 2019-10-27 NOTE — Telephone Encounter (Signed)
Informed of results.   Myra Rude, MD [facility] 10/27/2019, 8:19 AM

## 2019-10-29 DIAGNOSIS — J301 Allergic rhinitis due to pollen: Secondary | ICD-10-CM | POA: Diagnosis not present

## 2019-10-29 DIAGNOSIS — J3089 Other allergic rhinitis: Secondary | ICD-10-CM | POA: Diagnosis not present

## 2019-10-29 DIAGNOSIS — J3081 Allergic rhinitis due to animal (cat) (dog) hair and dander: Secondary | ICD-10-CM | POA: Diagnosis not present

## 2019-11-05 DIAGNOSIS — J3081 Allergic rhinitis due to animal (cat) (dog) hair and dander: Secondary | ICD-10-CM | POA: Diagnosis not present

## 2019-11-05 DIAGNOSIS — J301 Allergic rhinitis due to pollen: Secondary | ICD-10-CM | POA: Diagnosis not present

## 2019-11-11 DIAGNOSIS — J301 Allergic rhinitis due to pollen: Secondary | ICD-10-CM | POA: Diagnosis not present

## 2019-11-11 DIAGNOSIS — J3089 Other allergic rhinitis: Secondary | ICD-10-CM | POA: Diagnosis not present

## 2019-11-11 DIAGNOSIS — J3081 Allergic rhinitis due to animal (cat) (dog) hair and dander: Secondary | ICD-10-CM | POA: Diagnosis not present

## 2019-11-18 DIAGNOSIS — J3089 Other allergic rhinitis: Secondary | ICD-10-CM | POA: Diagnosis not present

## 2019-11-18 DIAGNOSIS — J3081 Allergic rhinitis due to animal (cat) (dog) hair and dander: Secondary | ICD-10-CM | POA: Diagnosis not present

## 2019-11-18 DIAGNOSIS — J301 Allergic rhinitis due to pollen: Secondary | ICD-10-CM | POA: Diagnosis not present

## 2019-11-19 DIAGNOSIS — J3081 Allergic rhinitis due to animal (cat) (dog) hair and dander: Secondary | ICD-10-CM | POA: Diagnosis not present

## 2019-11-19 DIAGNOSIS — J3089 Other allergic rhinitis: Secondary | ICD-10-CM | POA: Diagnosis not present

## 2019-11-19 DIAGNOSIS — J301 Allergic rhinitis due to pollen: Secondary | ICD-10-CM | POA: Diagnosis not present

## 2019-11-26 DIAGNOSIS — J3089 Other allergic rhinitis: Secondary | ICD-10-CM | POA: Diagnosis not present

## 2019-11-26 DIAGNOSIS — J301 Allergic rhinitis due to pollen: Secondary | ICD-10-CM | POA: Diagnosis not present

## 2019-11-26 DIAGNOSIS — J3081 Allergic rhinitis due to animal (cat) (dog) hair and dander: Secondary | ICD-10-CM | POA: Diagnosis not present

## 2019-11-28 ENCOUNTER — Encounter: Payer: Self-pay | Admitting: Family Medicine

## 2019-11-29 ENCOUNTER — Other Ambulatory Visit: Payer: Self-pay | Admitting: Family Medicine

## 2019-11-29 MED ORDER — GABAPENTIN 300 MG PO CAPS: 300.0000 mg | ORAL_CAPSULE | Freq: Three times a day (TID) | ORAL | 2 refills | Status: DC

## 2019-12-07 ENCOUNTER — Ambulatory Visit: Payer: BC Managed Care – PPO | Admitting: Neurology

## 2019-12-07 ENCOUNTER — Encounter: Payer: Self-pay | Admitting: Neurology

## 2019-12-07 ENCOUNTER — Other Ambulatory Visit: Payer: Self-pay

## 2019-12-07 VITALS — BP 110/64 | HR 65 | Temp 98.4°F | Ht 69.0 in | Wt 152.0 lb

## 2019-12-07 DIAGNOSIS — R202 Paresthesia of skin: Secondary | ICD-10-CM | POA: Diagnosis not present

## 2019-12-07 NOTE — Progress Notes (Signed)
dSubjective:    Patient ID: Travis Reeves is a 49 y.o. male.  HPI     Star Age, MD, PhD [facility] [address] [place], [place] [address]  Dear Dr. Anitra Lauth,   I saw your patient, Travis Reeves, upon your kind request, in my Neurologic clinic today for initial consultation of his paresthesias of the upper and lower extremities and abdomen.  The patient is unaccompanied today.  As you know, Travis Reeves is a 49 year old right-handed gentleman with an underlying medical history of asthma, eosinophilic esophagitis, BPH, anxiety, and allergic rhinitis, who reports intermittent itching and burning sensation in various areas of his body, started about 3 months ago, particularly affecting his abdominal area in the front, not all the way across.  He has had no changes in his medications, no changes in his shampoos or detergents or body wash, no new creams, no rash noticed but feels like a patchy itching sensation in different areas including the inside of both knees, both arms, sometimes even in the face.  He has no sustained numbness, no bowel or bladder problems, no vision problems, has known astigmatism.  He has had mid back pain since November 2020, had seen a Restaurant manager, fast food and started seeing sports medicine.  He has had extensive blood work through Dr. Raeford Razor and sports medicine which I reviewed, including iron studies, C3, C4, B12, folate, tryptase, CRP, ESR, uric acid, ANA. Last year, thyroid function was normal as well.  He does report recent increase in stress and anxiety, particularly what with his new job.  He reports a family history of depression affecting his father and paternal grandfather.  He reports no weakness, has had some tension headaches, no history of migraines.  He does not have any sudden onset of one-sided weakness or slurring of speech or droopy face.  He currently does not have any neck or back pain.  He has been on gabapentin  per Dr. Raeford Razor which has helped.  He takes 300 mg 3 times daily.  He does not drink a whole lot of water, admits that he may drink only 2 cups of water on an average day, likes to drink caffeine, 3-4 large cups per day on average, 1 soda per day, non-caffeine.  He drinks alcohol on the weekend in the form of beer. He has had mid back pain for the past for 5 months.  He has seen sports medicine for this. I reviewed your office note from 09/08/2019.   His Past Medical History Is Significant For: Past Medical History:  Diagnosis Date  . Allergic rhinitis    Pollen,cat and dog dander, chron allerg conjunctivitis (Dr. Lucianne Lei Winkle)--gets allergy immunotherapy injections.  . Anxiety    PMH of  . BPH with obstruction/lower urinary tract symptoms 12/2017   doing well on alfuzosin as of 04/2019 urol f/u  . Contusion of left ankle 04/2018   Xray nl.  Reassured by Ortho (Dr. French Ana)  . Eosinophilic esophagitis 74/25/9563   Esoph dilation, started PPI 01/2019.  . Mild intermittent asthma     His Past Surgical History Is Significant For: Past Surgical History:  Procedure Laterality Date  . ESOPHAGOGASTRODUODENOSCOPY  87/56/4332   Eosinophilic esophagitis->esoph dilation  . VASECTOMY  07/2008   Dr. Gaynelle Arabian    His Family History Is Significant For: Family History  Problem Relation Age of Onset  . Hypertension Mother   . Hyperlipidemia Mother   . Diabetes Mother   . Migraines Mother   .  Pancreatic cancer Mother        died 42  . Depression Father   . Arthritis Father   . Hypertension Father   . Hyperlipidemia Father   . Heart attack Father        > 70; S/P 2 stents  . Diabetes Father   . Colon polyps Father   . Cancer Paternal Grandmother        colon & stomach  . Transient ischemic attack Paternal Grandfather   . Breast cancer Paternal Aunt   . Allergy (severe) Son        on shots  . Esophageal cancer Neg Hx     His Social History Is Significant For: Social History    Socioeconomic History  . Marital status: Married    Spouse name: Not on file  . Number of children: 2  . Years of education: 68  . Highest education level: Not on file  Occupational History  . Occupation: Press photographer Rep  Tobacco Use  . Smoking status: Never Smoker  . Smokeless tobacco: Never Used  Substance and Sexual Activity  . Alcohol use: Yes    Comment:  occasionally  . Drug use: No  . Sexual activity: Yes    Birth control/protection: Surgical  Other Topics Concern  . Not on file  Social History Narrative   Married, 2 boys.   Educ: Bachelors in agricultural business.   Occup: Passenger transport manager.   No tob, some weekend alcohol.  No drugs.   Fun: Golf and exercise (running).   Social Determinants of Health   Financial Resource Strain:   . Difficulty of Paying Living Expenses:   Food Insecurity:   . Worried About Charity fundraiser in the Last Year:   . Arboriculturist in the Last Year:   Transportation Needs:   . Film/video editor (Medical):   Marland Kitchen Lack of Transportation (Non-Medical):   Physical Activity:   . Days of Exercise per Week:   . Minutes of Exercise per Session:   Stress:   . Feeling of Stress :   Social Connections:   . Frequency of Communication with Friends and Family:   . Frequency of Social Gatherings with Friends and Family:   . Attends Religious Services:   . Active Member of Clubs or Organizations:   . Attends Archivist Meetings:   Marland Kitchen Marital Status:     His Allergies Are:  Allergies  Allergen Reactions  . Zithromax [Azithromycin Dihydrate]     Nausea & vomiting  :   His Current Medications Are:  Outpatient Encounter Medications as of 12/07/2019  Medication Sig  . albuterol (PROVENTIL HFA) 108 (90 Base) MCG/ACT inhaler Inhale 2 puffs into the lungs every 6 (six) hours as needed for wheezing or shortness of breath.  . alfuzosin (UROXATRAL) 10 MG 24 hr tablet Take 10 mg by mouth daily. Take 1 daily.  . cetirizine (ZYRTEC) 10 MG  tablet Take 10 mg by mouth daily.  Marland Kitchen EPINEPHrine 0.3 mg/0.3 mL IJ SOAJ injection INJ UTD  . esomeprazole (NEXIUM) 40 MG capsule Take 1 capsule (40 mg total) by mouth daily before breakfast.  . fluticasone (FLONASE) 50 MCG/ACT nasal spray Place 1 spray into both nostrils daily. Must see new provider for future refills  . gabapentin (NEURONTIN) 300 MG capsule Take 1 capsule (300 mg total) by mouth 3 (three) times daily.  Marland Kitchen ibuprofen (ADVIL,MOTRIN) 200 MG tablet Take 200 mg by mouth every 6 (six) hours as needed.  No facility-administered encounter medications on file as of 12/07/2019.  :   Review of Systems:  Out of a complete 14 point review of systems, all are reviewed and negative with the exception of these symptoms as listed below: Review of Systems  Neurological:       Here for evaluation on burning/itching sensation located along his torso, bilateral knees and occationally in his head and arms. Pt reports he has been started on gabapentin and has benefited well.    Objective:  Neurological Exam  Physical Exam Physical Examination:   Vitals:   12/07/19 1422  BP: 110/64  Pulse: 65  Temp: 98.4 F (36.9 C)   General Examination: The patient is a very pleasant 49 y.o. male in no acute distress. He appears well-developed and well-nourished and well groomed.   HEENT: Normocephalic, atraumatic, pupils are equal, round and reactive to light and accommodation. Funduscopic exam is normal with sharp disc margins noted. Extraocular tracking is good without limitation to gaze excursion or nystagmus noted. Normal smooth pursuit is noted. Hearing is grossly intact. Face is symmetric with normal facial animation and normal facial sensation. Speech is clear with no dysarthria noted. There is no hypophonia. There is no lip, neck/head, jaw or voice tremor. Neck is supple with full range of passive and active motion. There are no carotid bruits on auscultation. Oropharynx exam reveals: mild mouth  dryness, adequate dental hygiene. Tongue protrudes centrally and palate elevates symmetrically.   Chest: Clear to auscultation without wheezing, rhonchi or crackles noted.  Heart: S1+S2+0, regular and normal without murmurs, rubs or gallops noted.   Abdomen: Soft, non-tender and non-distended with normal bowel sounds appreciated on auscultation.  No hair loss on abdomen, no rash, no sensory level on abdomen, preserved abdominal reflexes.  Preserved abdominal sensation.  Extremities: There is no pitting edema in the distal lower extremities bilaterally. Pedal pulses are intact.  Skin: Warm and dry without trophic changes noted. There are no varicose veins.  Musculoskeletal: exam reveals no obvious joint deformities, tenderness or joint swelling or erythema.   Neurologically:  Mental status: The patient is awake, alert and oriented in all 4 spheres. His immediate and remote memory, attention, language skills and fund of knowledge are appropriate. There is no evidence of aphasia, agnosia, apraxia or anomia. Speech is clear with normal prosody and enunciation. Thought process is linear. Mood is normal and affect is normal.  Cranial nerves II - XII are as described above under HEENT exam. In addition: shoulder shrug is normal with equal shoulder height noted. Motor exam: Normal bulk, strength and tone is noted.  No rash on the extremities, no focal or generalized atrophy, no twitching, no myoclonus, no fasciculations noted, no drift, tremor or rebound. Romberg is negative. Reflexes are 2+ throughout. Babinski: Toes are flexor bilaterally. Fine motor skills and coordination: intact with normal finger taps, normal hand movements, normal rapid alternating patting, normal foot taps and normal foot agility.  Cerebellar testing: No dysmetria or intention tremor on finger to nose testing. Heel to shin is unremarkable bilaterally. There is no truncal or gait ataxia.  Sensory exam: intact to light touch,  pinprick, vibration, temperature sense in the upper and lower extremities.  Gait, station and balance: He stands easily. No veering to one side is noted. No leaning to one side is noted. Posture is age-appropriate and stance is narrow based. Gait shows normal stride length and normal pace. No problems turning are noted. Tandem walk is unremarkable.   Assessment and Plan:  Assessment and Plan:  In summary, HENLEY BOETTNER is a very pleasant 49 y.o.-year old male with an underlying medical history of asthma, eosinophilic esophagitis, BPH, anxiety, and allergic rhinitis, who presents for evaluation of his paresthesias affecting his upper and lower extremities intermittently, started with the abdominal area in the mid abdomen, and rarely affects his face, symptoms are intermittent and described as burning and itching.  Neurological exam is normal and reassuring.  He had mid back pain and scapular pain for which he started seeing a chiropractor and then sports medicine specialty.  He has had x-rays and multiple blood tests.  I do not believe we need to add any blood work from my end of things.  I am not quite sure how to explain his symptoms.  I would like to proceed with additional testing from my end of things to rule out a structural cause of his symptoms with a brain MRI with and without contrast as well as EMG and nerve conduction velocity testing to look for signs of neuropathy versus radiculopathy versus myopathy.  Exam is normal and he is largely reassured today.  He has had some symptomatic relief with gabapentin.  He currently is taking 300 mg 3 times daily.  He is encouraged to stay better hydrated with water and limit his caffeine to 1 or 2 servings per day if possible.  We will keep him posted as to his test results by phone call and take it from there.   Thank you very much for allowing me to participate in the care of this nice patient. If I can be of any further assistance to you please do not  hesitate to call me at [phone].  Sincerely,   Star Age, MD, PhD

## 2019-12-07 NOTE — Patient Instructions (Addendum)
You have had extensive blood work, I do not believe we need to add any blood work today with the exception of a blood test to make sure your kidney and liver function are okay in order to proceed with your brain MRI.  Your latest kidney function test was last year and that is not recent enough.  Your neurological exam is normal thankfully.  I do not have a good explanation for your symptoms.  As discussed, we will proceed with a brain MRI and a nerve and muscle test called EMG/nerve conduction velocity test, we will schedule this test in our office.  Once we have insurance authorization for your brain MRI we will call you to schedule this and we will call you with the results and take it from there.

## 2019-12-08 LAB — COMPREHENSIVE METABOLIC PANEL
ALT: 19 IU/L (ref 0–44)
AST: 26 IU/L (ref 0–40)
Albumin/Globulin Ratio: 3.4 — ABNORMAL HIGH (ref 1.2–2.2)
Albumin: 4.8 g/dL (ref 4.0–5.0)
Alkaline Phosphatase: 87 IU/L (ref 39–117)
BUN/Creatinine Ratio: 13 (ref 9–20)
BUN: 13 mg/dL (ref 6–24)
Bilirubin Total: 0.6 mg/dL (ref 0.0–1.2)
CO2: 25 mmol/L (ref 20–29)
Calcium: 9.6 mg/dL (ref 8.7–10.2)
Chloride: 104 mmol/L (ref 96–106)
Creatinine, Ser: 1 mg/dL (ref 0.76–1.27)
GFR calc Af Amer: 103 mL/min/{1.73_m2} (ref >59)
GFR calc non Af Amer: 89 mL/min/{1.73_m2} (ref >59)
Globulin, Total: 1.4 g/dL — ABNORMAL LOW (ref 1.5–4.5)
Glucose: 85 mg/dL (ref 65–99)
Potassium: 4.5 mmol/L (ref 3.5–5.2)
Sodium: 143 mmol/L (ref 134–144)
Total Protein: 6.2 g/dL (ref 6.0–8.5)

## 2019-12-10 DIAGNOSIS — J3089 Other allergic rhinitis: Secondary | ICD-10-CM | POA: Diagnosis not present

## 2019-12-10 DIAGNOSIS — J3081 Allergic rhinitis due to animal (cat) (dog) hair and dander: Secondary | ICD-10-CM | POA: Diagnosis not present

## 2019-12-10 DIAGNOSIS — J301 Allergic rhinitis due to pollen: Secondary | ICD-10-CM | POA: Diagnosis not present

## 2019-12-11 HISTORY — PX: OTHER SURGICAL HISTORY: SHX169

## 2019-12-14 ENCOUNTER — Telehealth: Payer: Self-pay | Admitting: Neurology

## 2019-12-14 NOTE — Telephone Encounter (Signed)
LVM for pt to call back about scheduling mri  [employer] Auth: [id number] (exp. 12/10/19 to 01/08/20)

## 2019-12-14 NOTE — Telephone Encounter (Signed)
Pt returned call and LVM. Please call back when available.  

## 2019-12-15 MED ORDER — ALPRAZOLAM 0.5 MG PO TABS: ORAL_TABLET | ORAL | 0 refills | Status: DC

## 2019-12-15 NOTE — Telephone Encounter (Signed)
Pt has called asking to speak back with Irving Burton re: his MRI.  Please call him at [phone]

## 2019-12-15 NOTE — Telephone Encounter (Signed)
Also the patient informed me is claustrophobic and would like something to help him. He is aware to have a driver.

## 2019-12-15 NOTE — Telephone Encounter (Signed)
Patient returned my call and due to the cost he is going to wait right now.

## 2019-12-15 NOTE — Telephone Encounter (Signed)
I have ordered Xanax for patient's upcoming MRI due to anxiety/claustrophobia reported. Please inform patient or caregiver and remind them, that he should not drive after taking Xanax and have someone take him for the MRI appointment.   

## 2019-12-15 NOTE — Telephone Encounter (Signed)
I reached out to the pt and advised of Dr. Teofilo Pod recommendation. He verbalized understanding.

## 2019-12-15 NOTE — Telephone Encounter (Signed)
I spoke to the patient he is scheduled at [facility] for 12/28/19. 

## 2019-12-15 NOTE — Telephone Encounter (Signed)
x2 left voicemail for patient to call back.  

## 2019-12-15 NOTE — Addendum Note (Signed)
Addended by: Huston Foley on: 12/15/2019 03:26 PM   Modules accepted: Orders

## 2019-12-17 ENCOUNTER — Encounter: Payer: Self-pay | Admitting: Neurology

## 2019-12-17 DIAGNOSIS — J3089 Other allergic rhinitis: Secondary | ICD-10-CM | POA: Diagnosis not present

## 2019-12-17 DIAGNOSIS — J3081 Allergic rhinitis due to animal (cat) (dog) hair and dander: Secondary | ICD-10-CM | POA: Diagnosis not present

## 2019-12-17 DIAGNOSIS — J301 Allergic rhinitis due to pollen: Secondary | ICD-10-CM | POA: Diagnosis not present

## 2019-12-28 ENCOUNTER — Other Ambulatory Visit: Payer: Self-pay

## 2019-12-28 ENCOUNTER — Ambulatory Visit: Payer: BC Managed Care – PPO

## 2019-12-28 DIAGNOSIS — R202 Paresthesia of skin: Secondary | ICD-10-CM | POA: Diagnosis not present

## 2019-12-28 MED ORDER — GADOBENATE DIMEGLUMINE 529 MG/ML IV SOLN
15.0000 mL | Freq: Once | INTRAVENOUS | Status: AC | PRN
  Administered 2019-12-28: 15 mL via INTRAVENOUS

## 2019-12-29 NOTE — Progress Notes (Signed)
Please advise patient that his brain MRI with and without contrast was reported as normal.

## 2019-12-30 DIAGNOSIS — J3081 Allergic rhinitis due to animal (cat) (dog) hair and dander: Secondary | ICD-10-CM | POA: Diagnosis not present

## 2019-12-30 DIAGNOSIS — J3089 Other allergic rhinitis: Secondary | ICD-10-CM | POA: Diagnosis not present

## 2019-12-30 DIAGNOSIS — J301 Allergic rhinitis due to pollen: Secondary | ICD-10-CM | POA: Diagnosis not present

## 2019-12-31 ENCOUNTER — Other Ambulatory Visit: Payer: Self-pay | Admitting: Family Medicine

## 2019-12-31 ENCOUNTER — Encounter: Payer: Self-pay | Admitting: Family Medicine

## 2019-12-31 MED ORDER — GABAPENTIN 300 MG PO CAPS: 300.0000 mg | ORAL_CAPSULE | Freq: Three times a day (TID) | ORAL | 2 refills | Status: DC

## 2019-12-31 NOTE — Progress Notes (Signed)
Refilled gabapentin.   Myra Rude, MD [facility] 12/31/2019, 5:24 PM

## 2020-01-03 ENCOUNTER — Ambulatory Visit: Payer: BC Managed Care – PPO | Admitting: Neurology

## 2020-01-03 ENCOUNTER — Other Ambulatory Visit: Payer: Self-pay

## 2020-01-03 ENCOUNTER — Telehealth: Payer: Self-pay

## 2020-01-03 ENCOUNTER — Encounter: Payer: Self-pay | Admitting: Neurology

## 2020-01-03 ENCOUNTER — Ambulatory Visit (INDEPENDENT_AMBULATORY_CARE_PROVIDER_SITE_OTHER): Payer: BC Managed Care – PPO | Admitting: Neurology

## 2020-01-03 DIAGNOSIS — R208 Other disturbances of skin sensation: Secondary | ICD-10-CM

## 2020-01-03 DIAGNOSIS — R202 Paresthesia of skin: Secondary | ICD-10-CM

## 2020-01-03 NOTE — Progress Notes (Signed)
Please refer to EMG and nerve conduction procedure note.  

## 2020-01-03 NOTE — Telephone Encounter (Signed)
I contacted the pt and advised of results via vm( ok per dpr) Pt was advised to call back if he had any questions.

## 2020-01-03 NOTE — Telephone Encounter (Signed)
-----   Message from Huston Foley, MD sent at 01/03/2020  2:06 PM EDT ----- Please call and advise the patient that the recent EMG and nerve conduction velocity test, which is the electrical nerve and muscle test we we performed, was reported as within normal limits. We checked for abnormal electrical discharges in the muscles or nerves and the report suggested normal findings. No further action is required on this test at this time.  At this juncture, I recommend he follow-up with his sports medicine specialist for his back pain and his primary care physician.

## 2020-01-03 NOTE — Procedures (Signed)
     HISTORY:  Travis Reeves is a 49 year old gentleman with a 3 to 54-month history of burning and itching sensations across the abdomen and chest, also affecting the face, arms, and legs.  The symptoms may come and go, they have been improved with gabapentin.  The patient is being evaluated for a peripheral nerve disorder.  NERVE CONDUCTION STUDIES:  Nerve conduction studies were performed on the right upper extremity. The distal motor latencies and motor amplitudes for the median and ulnar nerves were within normal limits. The nerve conduction velocities for these nerves were also normal. The sensory latencies for the median and ulnar nerves were normal. The F wave latency for the ulnar nerve was within normal limits.  Nerve conduction studies were performed on the right lower extremity. The distal motor latencies and motor amplitudes for the peroneal and posterior tibial nerves were within normal limits. The nerve conduction velocities for these nerves were also normal. The sensory latencies for the peroneal and sural nerves were within normal limits. The F wave latency for the posterior tibial nerve was within normal limits.   EMG STUDIES:  EMG study was performed on the right lower extremity:  The tibialis anterior muscle reveals 2 to 4K motor units with full recruitment. No fibrillations or positive waves were seen. The peroneus tertius muscle reveals 2 to 4K motor units with full recruitment. No fibrillations or positive waves were seen. The medial gastrocnemius muscle reveals 1 to 3K motor units with full recruitment. No fibrillations or positive waves were seen. The vastus lateralis muscle reveals 2 to 4K motor units with full recruitment. No fibrillations or positive waves were seen. The iliopsoas muscle reveals 2 to 4K motor units with full recruitment. No fibrillations or positive waves were seen. The biceps femoris muscle (long head) reveals 2 to 4K motor units with full  recruitment. No fibrillations or positive waves were seen. The lumbosacral paraspinal muscles were tested at 3 levels, and revealed no abnormalities of insertional activity at all 3 levels tested. There was good relaxation.   IMPRESSION:  Nerve conduction studies done on the right upper and lower extremities were within normal limits.  There is no evidence of a peripheral neuropathy.  EMG evaluation of the right lower extremity was unremarkable, there is no evidence of an overlying lumbar radiculopathy.  Marlan Palau MD 01/03/2020 1:53 PM  [facility] [address] [place], [place] [address]  Phone [phone] Fax [phone]

## 2020-01-03 NOTE — Progress Notes (Addendum)
Please call and advise the patient that the recent EMG and nerve conduction velocity test, which is the electrical nerve and muscle test we we performed, was reported as within normal limits. We checked for abnormal electrical discharges in the muscles or nerves and the report suggested normal findings. No further action is required on this test at this time.  At this juncture, I recommend he follow-up with his sports medicine specialist for his back pain and his primary care physician.         MNC    Nerve / Sites Muscle Latency Ref. Amplitude Ref. Rel Amp Segments Distance Velocity Ref. Area    ms ms mV mV %  cm m/s m/s mVms  R Median - APB     Wrist APB 2.8 ?4.4 8.9 ?4.0 100 Wrist - APB 7   30.6     Upper arm APB 6.6  8.3  92.8 Upper arm - Wrist 24 63 ?49 30.5  R Ulnar - ADM     Wrist ADM 2.7 ?3.3 11.6 ?6.0 100 Wrist - ADM 7   37.2     B.Elbow ADM 6.1  11.0  94.3 B.Elbow - Wrist 23 69 ?49 34.5     A.Elbow ADM 7.6  10.9  99.2 A.Elbow - B.Elbow 10 64 ?49 34.1         A.Elbow - Wrist      R Peroneal - EDB     Ankle EDB 4.3 ?6.5 4.6 ?2.0 100 Ankle - EDB 9   16.9     Fib head EDB 10.1  4.0  86.7 Fib head - Ankle 29 50 ?44 16.0     Pop fossa EDB 12.1  3.5  87.9 Pop fossa - Fib head 10 49 ?44 13.9         Pop fossa - Ankle      R Tibial - AH     Ankle AH 3.8 ?5.8 11.7 ?4.0 100 Ankle - AH 9   21.1     Pop fossa AH 12.4  10.1  85.9 Pop fossa - Ankle 39 45 ?41 19.6             SNC    Nerve / Sites Rec. Site Peak Lat Ref.  Amp Ref. Segments Distance    ms ms V V  cm  R Sural - Ankle (Calf)     Calf Ankle 3.4 ?4.4 12 ?6 Calf - Ankle 14  R Superficial peroneal - Ankle     Lat leg Ankle 3.7 ?4.4 7 ?6 Lat leg - Ankle 14  R Median - Orthodromic (Dig II, Mid palm)     Dig II Wrist 2.7 ?3.4 24 ?10 Dig II - Wrist 13  R Ulnar - Orthodromic, (Dig V, Mid palm)     Dig V Wrist 2.5 ?3.1 7 ?5 Dig V - Wrist 62             F  Wave    Nerve F Lat Ref.   ms ms  R Tibial - AH 51.4 ?56.0  R  Ulnar - ADM 28.8 ?32.0

## 2020-01-05 DIAGNOSIS — J3089 Other allergic rhinitis: Secondary | ICD-10-CM | POA: Diagnosis not present

## 2020-01-05 DIAGNOSIS — J3081 Allergic rhinitis due to animal (cat) (dog) hair and dander: Secondary | ICD-10-CM | POA: Diagnosis not present

## 2020-01-14 DIAGNOSIS — J3089 Other allergic rhinitis: Secondary | ICD-10-CM | POA: Diagnosis not present

## 2020-01-14 DIAGNOSIS — J3081 Allergic rhinitis due to animal (cat) (dog) hair and dander: Secondary | ICD-10-CM | POA: Diagnosis not present

## 2020-01-21 DIAGNOSIS — J3089 Other allergic rhinitis: Secondary | ICD-10-CM | POA: Diagnosis not present

## 2020-01-21 DIAGNOSIS — J3081 Allergic rhinitis due to animal (cat) (dog) hair and dander: Secondary | ICD-10-CM | POA: Diagnosis not present

## 2020-01-21 DIAGNOSIS — J301 Allergic rhinitis due to pollen: Secondary | ICD-10-CM | POA: Diagnosis not present

## 2020-01-26 ENCOUNTER — Other Ambulatory Visit: Payer: Self-pay

## 2020-01-26 MED ORDER — ESOMEPRAZOLE MAGNESIUM 40 MG PO CPDR: 40.0000 mg | DELAYED_RELEASE_CAPSULE | Freq: Every day | ORAL | 0 refills | Status: DC

## 2020-02-08 DIAGNOSIS — J301 Allergic rhinitis due to pollen: Secondary | ICD-10-CM | POA: Diagnosis not present

## 2020-02-08 DIAGNOSIS — J3089 Other allergic rhinitis: Secondary | ICD-10-CM | POA: Diagnosis not present

## 2020-02-08 DIAGNOSIS — J3081 Allergic rhinitis due to animal (cat) (dog) hair and dander: Secondary | ICD-10-CM | POA: Diagnosis not present

## 2020-02-18 DIAGNOSIS — J3089 Other allergic rhinitis: Secondary | ICD-10-CM | POA: Diagnosis not present

## 2020-02-18 DIAGNOSIS — J301 Allergic rhinitis due to pollen: Secondary | ICD-10-CM | POA: Diagnosis not present

## 2020-02-18 DIAGNOSIS — J3081 Allergic rhinitis due to animal (cat) (dog) hair and dander: Secondary | ICD-10-CM | POA: Diagnosis not present

## 2020-03-03 DIAGNOSIS — J3081 Allergic rhinitis due to animal (cat) (dog) hair and dander: Secondary | ICD-10-CM | POA: Diagnosis not present

## 2020-03-03 DIAGNOSIS — J3089 Other allergic rhinitis: Secondary | ICD-10-CM | POA: Diagnosis not present

## 2020-03-07 ENCOUNTER — Encounter: Payer: Self-pay | Admitting: Family Medicine

## 2020-03-10 DIAGNOSIS — J301 Allergic rhinitis due to pollen: Secondary | ICD-10-CM | POA: Diagnosis not present

## 2020-03-10 DIAGNOSIS — J3081 Allergic rhinitis due to animal (cat) (dog) hair and dander: Secondary | ICD-10-CM | POA: Diagnosis not present

## 2020-03-10 DIAGNOSIS — J3089 Other allergic rhinitis: Secondary | ICD-10-CM | POA: Diagnosis not present

## 2020-03-14 ENCOUNTER — Other Ambulatory Visit: Payer: Self-pay

## 2020-03-14 DIAGNOSIS — J3089 Other allergic rhinitis: Secondary | ICD-10-CM | POA: Diagnosis not present

## 2020-03-14 DIAGNOSIS — J301 Allergic rhinitis due to pollen: Secondary | ICD-10-CM | POA: Diagnosis not present

## 2020-03-14 DIAGNOSIS — J3081 Allergic rhinitis due to animal (cat) (dog) hair and dander: Secondary | ICD-10-CM | POA: Diagnosis not present

## 2020-03-15 ENCOUNTER — Encounter: Payer: Self-pay | Admitting: Family Medicine

## 2020-03-15 ENCOUNTER — Ambulatory Visit: Payer: BC Managed Care – PPO | Admitting: Family Medicine

## 2020-03-15 ENCOUNTER — Other Ambulatory Visit: Payer: Self-pay

## 2020-03-15 VITALS — BP 110/75 | HR 61 | Temp 97.8°F | Resp 16 | Ht 69.0 in | Wt 150.2 lb

## 2020-03-15 DIAGNOSIS — R202 Paresthesia of skin: Secondary | ICD-10-CM | POA: Diagnosis not present

## 2020-03-15 MED ORDER — GABAPENTIN 300 MG PO CAPS: 300.0000 mg | ORAL_CAPSULE | Freq: Three times a day (TID) | ORAL | 2 refills | Status: DC

## 2020-03-15 NOTE — Progress Notes (Signed)
OFFICE VISIT  03/15/2020   CC:  Chief Complaint  Patient presents with  . Abdominal Pain    prickling, stabbing occuring on mid to center left side x 6-7 months   HPI:    Patient is a 49 y.o. Caucasian male who presents for f/u paresthesias. A/P as of last visit: "1) Thoracic back pain, left side. Unclear etiology.  Imaging has been reassuring (chest xray and T spin plain films). I discussed further diagnostic imaging with MRI of T spine vs going back to Dr. Raeford Razor in sports medicine to get rechecked.  He says he does have appt for f/u with Dr. Raeford Razor so we'll hold off on any imaging right now. I don't think his pain is neuropathy, so I'll hold off on trial of gabapentin.  2) Paresthesias, widespread: unknown etiology. I don't think it is related to his back issue, but I admitted I did not know what was causing this. We discussed neuro referral in future if it does not go away or if it is getting worse.  He expressed understanding and was agreeable to obs approach at this time."  INTERIM HX: Since I saw him in January this year:  He had f/u with Dr. Raeford Razor 09/2019->gabapentin trial initiated.  Large battery of blood tests done and all normal (iron studies, C3, C4, B12, folate, tryptase, CRP, ESR, uric acid, ANA).  TSH has been normal. Referred to neurology, Dr. Rexene Alberts eval'd him on 12/07/19 and exam was normal.  He was continued on gabapentin, MRI brain and NCS/EMG normal, no blood testing done.  Pt reassured but no explanation for sx's could be determined.  Currently: what is really bothering him now is a prickly sensation in chest, upper and mid abd just to the L of midline.  He finds the sensation hard to describe, not painful but "just annoying". Describes same sensation in other areas occ in forehead and inner aspect of R knee. Occurs less often on gabapentin.  When he is late taking a dose he feels the sx's more. No side effects from the gabapentin. His pain in the back is no  longer a problem. No weakness anywhere.  Able to run 40 miles per week still. No rash or skin changes.  No substernal burning, GER, or dysphagia lately. No depressed mood.  He admits he is "high strung".  Took a new, higher responsibility job 12/2019 but all sx's were present prior to the job change. All of the above sx's started approx 06/2019 after he had an episode of pleuritic type CP, L sided.   Past Medical History:  Diagnosis Date  . Allergic rhinitis    Pollen,cat and dog dander, chron allerg conjunctivitis (Dr. Lucianne Lei Winkle)--gets allergy immunotherapy injections.  . Anxiety    PMH of  . BPH with obstruction/lower urinary tract symptoms 12/2017   doing well on alfuzosin as of 04/2019 urol f/u  . Contusion of left ankle 04/2018   Xray nl.  Reassured by Ortho (Dr. French Ana)  . Eosinophilic esophagitis 95/63/8756   Esoph dilation, started PPI 01/2019.  . Mild intermittent asthma   . Numbness and tingling    various areas: MRI brain normal 2021 (Dr. Rexene Alberts)    Past Surgical History:  Procedure Laterality Date  . ESOPHAGOGASTRODUODENOSCOPY  43/32/9518   Eosinophilic esophagitis->esoph dilation  . NCS/EMG  12/2019   R upper and R lower ext's NORMAL (Dr. Rexene Alberts)  . VASECTOMY  07/2008   Dr. Gaynelle Arabian    Outpatient Medications Prior to Visit  Medication  Sig Dispense Refill  . albuterol (PROVENTIL HFA) 108 (90 Base) MCG/ACT inhaler Inhale 2 puffs into the lungs every 6 (six) hours as needed for wheezing or shortness of breath. 1 Inhaler 1  . alfuzosin (UROXATRAL) 10 MG 24 hr tablet Take 10 mg by mouth daily. Take 1 daily.    . cetirizine (ZYRTEC) 10 MG tablet Take 10 mg by mouth daily.    Marland Kitchen esomeprazole (NEXIUM) 40 MG capsule Take 1 capsule (40 mg total) by mouth daily before breakfast. 90 capsule 0  . gabapentin (NEURONTIN) 300 MG capsule Take 1 capsule (300 mg total) by mouth 3 (three) times daily. 90 capsule 2  . ALPRAZolam (XANAX) 0.5 MG tablet Take 1-2 pills as needed on call to  MRI. May use a 3rd pill if needed. (Patient not taking: Reported on 03/15/2020) 3 tablet 0  . EPINEPHrine 0.3 mg/0.3 mL IJ SOAJ injection INJ UTD (Patient not taking: Reported on 03/15/2020)  1  . fluticasone (FLONASE) 50 MCG/ACT nasal spray Place 1 spray into both nostrils daily. Must see new provider for future refills (Patient not taking: Reported on 03/15/2020) 16 g 0   No facility-administered medications prior to visit.    Allergies  Allergen Reactions  . Zithromax [Azithromycin Dihydrate]     Nausea & vomiting    ROS As per HPI  PE: Vitals with BMI 03/15/2020 12/07/2019 10/25/2019  Height '5\' 9"'  '5\' 9"'  '5\' 9"'   Weight 150 lbs 3 oz 152 lbs 145 lbs  BMI 22.17 52.48 18.5  Systolic 909 311 216  Diastolic 75 64 81  Pulse 61 65 52  O2 sat 96% on RA today.  Gen: Alert, well appearing.  Patient is oriented to person, place, time, and situation. AFFECT: pleasant, lucid thought and speech. CV: RRR, no m/r/g.   LUNGS: CTA bilat, nonlabored resps, good aeration in all lung fields. ABD: soft, NT, ND, BS normal.  No hepatospenomegaly or mass.  No bruits. EXT: no clubbing or cyanosis.  no edema.  Skin - no sores or suspicious lesions or rashes or color changes Neuro: CN 2-12 intact bilaterally, strength 5/5 in proximal and distal upper extremities and lower extremities bilaterally.  No sensory deficits.  No tremor.  No disdiadochokinesis.  No ataxia.  Upper extremity and lower extremity DTRs symmetric.  No pronator drift.  LABS:  No new labs today.  IMPRESSION AND PLAN:  Paresthesias: random locations and can be on either side of his body (trunk as well as face and extremities, but "epicenter" seems to be L upper abd area).  Decent response to gabapentin at 339m tid dosing.   Discussed entire w/u thus far, discussed the eval and findings by me, Dr. SRosiland Oz and Dr. ARexene Alberts Extensive blood testing plus imaging all normal. No explanation for his sx's. Discussed options of referral for 2nd  neurologic opinion at a tertiary care center vs watchful waiting approach and he chose [facility] so [facility] Continue gabapentin 3034mtid.  Spent 30 min with pt today reviewing HPI, reviewing relevant past history, doing exam, reviewing and discussing lab and imaging data, and formulating plans.  An After Visit Summary was printed and given to the patient.  FOLLOW UP: Return in about 3 months (around 06/15/2020) for f/u paresthesias.  Signed:  PhCrissie SicklesMD           03/15/2020

## 2020-03-21 DIAGNOSIS — D1801 Hemangioma of skin and subcutaneous tissue: Secondary | ICD-10-CM | POA: Diagnosis not present

## 2020-03-21 DIAGNOSIS — L814 Other melanin hyperpigmentation: Secondary | ICD-10-CM | POA: Diagnosis not present

## 2020-03-21 DIAGNOSIS — D225 Melanocytic nevi of trunk: Secondary | ICD-10-CM | POA: Diagnosis not present

## 2020-03-27 DIAGNOSIS — J3089 Other allergic rhinitis: Secondary | ICD-10-CM | POA: Diagnosis not present

## 2020-03-27 DIAGNOSIS — H1045 Other chronic allergic conjunctivitis: Secondary | ICD-10-CM | POA: Diagnosis not present

## 2020-03-27 DIAGNOSIS — J3081 Allergic rhinitis due to animal (cat) (dog) hair and dander: Secondary | ICD-10-CM | POA: Diagnosis not present

## 2020-03-27 DIAGNOSIS — J301 Allergic rhinitis due to pollen: Secondary | ICD-10-CM | POA: Diagnosis not present

## 2020-04-05 DIAGNOSIS — J3081 Allergic rhinitis due to animal (cat) (dog) hair and dander: Secondary | ICD-10-CM | POA: Diagnosis not present

## 2020-04-05 DIAGNOSIS — J301 Allergic rhinitis due to pollen: Secondary | ICD-10-CM | POA: Diagnosis not present

## 2020-04-05 DIAGNOSIS — J3089 Other allergic rhinitis: Secondary | ICD-10-CM | POA: Diagnosis not present

## 2020-04-07 ENCOUNTER — Other Ambulatory Visit: Payer: Self-pay | Admitting: Internal Medicine

## 2020-04-12 ENCOUNTER — Encounter: Payer: Self-pay | Admitting: Family Medicine

## 2020-04-12 NOTE — Telephone Encounter (Signed)
Pls have Travis Reeves or Travis Reeves look into why the neurology referral I ordered on 03/15/20 is not happening-thx

## 2020-04-12 NOTE — Telephone Encounter (Signed)
Patient was last seen 03/15/20 regarding this, given Rx for Gabapentin #90 with 2 refills at that time as well. A referral was done for [facility] neurology. He has not received a call yet about scheduling.   Please advise, thanks.

## 2020-04-18 NOTE — Telephone Encounter (Signed)
[name] calling from [facility] called because documents for Travis Reeves (DOB 04/14/1971) were going to incorrect fax number.   The correct fax number is Neurology is [phone].  Please send documents to the correct number at [facility].  Thank you.

## 2020-04-19 ENCOUNTER — Encounter: Payer: Self-pay | Admitting: Nurse Practitioner

## 2020-04-19 ENCOUNTER — Ambulatory Visit: Payer: BC Managed Care – PPO | Admitting: Nurse Practitioner

## 2020-04-19 VITALS — BP 106/78 | HR 66 | Ht 69.0 in | Wt 150.5 lb

## 2020-04-19 DIAGNOSIS — R202 Paresthesia of skin: Secondary | ICD-10-CM

## 2020-04-19 DIAGNOSIS — J301 Allergic rhinitis due to pollen: Secondary | ICD-10-CM | POA: Diagnosis not present

## 2020-04-19 DIAGNOSIS — J3089 Other allergic rhinitis: Secondary | ICD-10-CM | POA: Diagnosis not present

## 2020-04-19 DIAGNOSIS — J3081 Allergic rhinitis due to animal (cat) (dog) hair and dander: Secondary | ICD-10-CM | POA: Diagnosis not present

## 2020-04-19 NOTE — Patient Instructions (Signed)
If you are age 47 or older, your body mass index should be between 23-30. Your Body mass index is 22.22 kg/m. If this is out of the aforementioned range listed, please consider follow up with your Primary Care Provider.  If you are age 93 or younger, your body mass index should be between 19-25. Your Body mass index is 22.22 kg/m. If this is out of the aformentioned range listed, please consider follow up with your Primary Care Provider.   Call back to schedule colonoscopy once other medical issues are further evaluated.

## 2020-04-19 NOTE — Progress Notes (Signed)
ASSESSMENT AND PLAN    # Generalized itching / burning of sensation in multiple area including epigastrium, scalp, bilateral arms and bilateral proximal thighs. Symptoms relieved with BID Gabapentin.  --The generalized burning sensation not likely GI in origin --Has seen numerous specialists with negative extensive evaluation --Supposed to be getting a second Neurology opinion at [facility] --If characteristics of epigastric burning change ( clearly becomes  separate from the generalized burning in scalp, arms, legs) then happy to discuss EGD.    # Colon cancer screening.  --He will call to schedule a colonoscopy once above symptoms have been completely evaluated. That way we can talk about adding on an EGD should need for it become evident.     HISTORY OF PRESENT ILLNESS     Primary Gastroenterologist : Stan Head, MD  Chief Complaint : burning / itching of stomach, scalp, arms and upper thighs  DREVON PLOG is a 49 y.o. male with PMH / PSH significant for,  but not necessarily limited to: Eosinophilic esophagitis.Patient was last seen 1 year ago for follow-up of eosinophilic esophagitis.  His swallowing had greatly improved following esophageal dilation and PPI therapy   Interval History:  No swallowing problems.   Patient complains of itching and burning sensation in epigastrium, chest, arms, bilateral proximal thighs and scalp. His abdomen is the "epicenter" of the burning / itching sensation.  Symptoms initially started with pain in left scapula area for which he was referred to Sports Medicine and was prescribed Gabapentin.  The medication works great but he cannot take any less than BID or gets recurrent symptoms. Says he has seen countless specialists for the burning including Sports Medicine, Neurology, Cardiology.  Thoracic spine x-ray, CXR both negative.MRI of the brain normal.  EMG and nerve conduction studies negative .  Patient says he wore a heart monitor  which was normal.  CMP, CBC, CK , B12, CRP all normal . Patient says he is here to "check off the box" because extensive workup has thus far been unrevealing. Patient is to be scheduled to see another Neurologist at [facility] for a second opinion. The epigastric discomfort can occur independently of the other areas but the burning sensation is the same. The burning can sometimes get worse with eating but not always. He takes Nexium everyday. He generally takes a baby ASA daily and Advil as needed which is on average 20 pills / month. No N/V nor bowel changes.     Previous Endoscopic Evaluations / Pertinent Studies:   June 2020 EGD for dysphagia --findings suggestive of EoE, biopsies confirmed.  --Dilated a ring like stenosis in esophagus   Past Medical History:  Diagnosis Date  . Allergic rhinitis    Pollen,cat and dog dander, chron allerg conjunctivitis (Dr. Zenaida Niece Winkle)--gets allergy immunotherapy injections.  . Anxiety    PMH of  . BPH with obstruction/lower urinary tract symptoms 12/2017   doing well on alfuzosin as of 04/2019 urol f/u  . Contusion of left ankle 04/2018   Xray nl.  Reassured by Ortho (Dr. Madelon Lips)  . Eosinophilic esophagitis 01/13/2019   Esoph dilation, started PPI 01/2019.  . Mild intermittent asthma   . Numbness and tingling    various areas: MRI brain normal 2021 (Dr. Frances Furbish)    Current Medications, Allergies, Past Surgical History, Family History and Social History were reviewed in [employer] electronic medical record.   Review of Systems: No chest pain. No shortness of breath. No urinary complaints.  PHYSICAL EXAM :    Wt Readings from Last 3 Encounters:  03/15/20 150 lb 3.2 oz (68.1 kg)  12/07/19 152 lb (68.9 kg)  10/25/19 145 lb (65.8 kg)    There were no vitals taken for this visit. Constitutional:  Pleasant male in no acute distress. Psychiatric: Normal mood and affect. Behavior is normal. EENT: Pupils normal.  Conjunctivae are normal. No  scleral icterus. Neck supple.  Cardiovascular: Normal rate, regular rhythm. No edema Pulmonary/chest: Effort normal and breath sounds normal. No wheezing, rales or rhonchi. Abdominal: Soft, nondistended, nontender. Bowel sounds active throughout. There are no masses palpable. No hepatomegaly. Neurological: Alert and oriented to person place and time. Skin: Skin is warm and dry. No rashes noted.  Willette Cluster, NP  04/19/2020, 8:20 AM   I spent 30 minutes total reviewing records, obtaining history, performing exam, counseling patient and documenting visit / findings.   Cc:   Jeoffrey Massed, MD

## 2020-04-20 ENCOUNTER — Telehealth: Payer: Self-pay

## 2020-04-20 ENCOUNTER — Encounter: Payer: Self-pay | Admitting: Family Medicine

## 2020-04-20 DIAGNOSIS — R202 Paresthesia of skin: Secondary | ICD-10-CM

## 2020-04-20 NOTE — Telephone Encounter (Signed)
I am sorry but we have done extensive investigations into these complaints and cannot find anything to explain them. I have nothing new to suggest except keep plan for f/u with neurologist that we recently did referral to.-thx

## 2020-04-20 NOTE — Telephone Encounter (Signed)
Pt states that he is having pain in my abdomen. Chest, & back.    Not sure if these are all related, but its bothering me.  He is still waiting on Neuro to contact him.

## 2020-04-21 NOTE — Telephone Encounter (Signed)
MyChart message read.

## 2020-04-28 DIAGNOSIS — J3081 Allergic rhinitis due to animal (cat) (dog) hair and dander: Secondary | ICD-10-CM | POA: Diagnosis not present

## 2020-04-28 DIAGNOSIS — J3089 Other allergic rhinitis: Secondary | ICD-10-CM | POA: Diagnosis not present

## 2020-04-28 DIAGNOSIS — J301 Allergic rhinitis due to pollen: Secondary | ICD-10-CM | POA: Diagnosis not present

## 2020-05-03 DIAGNOSIS — J3089 Other allergic rhinitis: Secondary | ICD-10-CM | POA: Diagnosis not present

## 2020-05-03 DIAGNOSIS — J3081 Allergic rhinitis due to animal (cat) (dog) hair and dander: Secondary | ICD-10-CM | POA: Diagnosis not present

## 2020-05-03 DIAGNOSIS — J301 Allergic rhinitis due to pollen: Secondary | ICD-10-CM | POA: Diagnosis not present

## 2020-05-03 NOTE — Addendum Note (Signed)
Addended by: Jeoffrey Massed on: 05/03/2020 03:13 PM   Modules accepted: Orders

## 2020-05-03 NOTE — Telephone Encounter (Signed)
I'll enter new neurology referral for [name]. Travis Reeves, if [name] does not have any availability in the next 1 month then pls see if [facility] has something in the next 1 mo-thx!

## 2020-05-03 NOTE — Telephone Encounter (Signed)
Pt had referral placed early last month for [facility] neurology office but they sent a return fax stating they are not seeing accepting new patients. Please advise, thanks.

## 2020-05-03 NOTE — Telephone Encounter (Signed)
MyChart message read.

## 2020-05-08 NOTE — Telephone Encounter (Signed)
Voicemail from patient  - referral needs to be changed to [facility].  Thank you.

## 2020-05-08 NOTE — Telephone Encounter (Signed)
Please assist with referral change, thanks

## 2020-05-11 NOTE — Telephone Encounter (Signed)
Left VM for patient that [name] doesn't have any appointments available until 2022 and [facility] end of November.

## 2020-05-14 ENCOUNTER — Encounter: Payer: Self-pay | Admitting: Family Medicine

## 2020-05-15 DIAGNOSIS — J301 Allergic rhinitis due to pollen: Secondary | ICD-10-CM | POA: Diagnosis not present

## 2020-05-15 DIAGNOSIS — J3089 Other allergic rhinitis: Secondary | ICD-10-CM | POA: Diagnosis not present

## 2020-05-15 DIAGNOSIS — J3081 Allergic rhinitis due to animal (cat) (dog) hair and dander: Secondary | ICD-10-CM | POA: Diagnosis not present

## 2020-05-15 MED ORDER — GABAPENTIN 300 MG PO CAPS: ORAL_CAPSULE | ORAL | 2 refills | Status: DC

## 2020-05-15 NOTE — Telephone Encounter (Signed)
Go ahead and increase each dose to 600mg  starting with tonight. I'll send in new rx for the 300 mg tabs with instructions to take 1-2 tabs three times per day and I'll tell pharmacist to "fill upon patient request".

## 2020-05-23 NOTE — Telephone Encounter (Signed)
Faxed referral to [facility] per patient request

## 2020-05-23 NOTE — Telephone Encounter (Signed)
Noted  

## 2020-05-26 ENCOUNTER — Telehealth: Payer: Self-pay

## 2020-05-26 NOTE — Telephone Encounter (Signed)
Patient states that his insurance is requiring a Prior Auth For gabapentin (NEURONTIN) 300 MG capsule [[id number]]   [facility]  Patient will be out of meds this weekend.  He just found out it is needing prior auth.  Please call [phone]

## 2020-05-26 NOTE — Telephone Encounter (Signed)
Spoke with pt and sent MyChart message. Pt is ok with paying out of pocket for medication.

## 2020-06-02 DIAGNOSIS — J3081 Allergic rhinitis due to animal (cat) (dog) hair and dander: Secondary | ICD-10-CM | POA: Diagnosis not present

## 2020-06-02 DIAGNOSIS — J301 Allergic rhinitis due to pollen: Secondary | ICD-10-CM | POA: Diagnosis not present

## 2020-06-02 DIAGNOSIS — J3089 Other allergic rhinitis: Secondary | ICD-10-CM | POA: Diagnosis not present

## 2020-06-03 ENCOUNTER — Encounter: Payer: Self-pay | Admitting: Family Medicine

## 2020-06-09 DIAGNOSIS — J3089 Other allergic rhinitis: Secondary | ICD-10-CM | POA: Diagnosis not present

## 2020-06-09 DIAGNOSIS — J3081 Allergic rhinitis due to animal (cat) (dog) hair and dander: Secondary | ICD-10-CM | POA: Diagnosis not present

## 2020-06-09 DIAGNOSIS — J301 Allergic rhinitis due to pollen: Secondary | ICD-10-CM | POA: Diagnosis not present

## 2020-06-16 ENCOUNTER — Ambulatory Visit: Payer: BC Managed Care – PPO | Admitting: Family Medicine

## 2020-06-16 DIAGNOSIS — J3089 Other allergic rhinitis: Secondary | ICD-10-CM | POA: Diagnosis not present

## 2020-06-16 DIAGNOSIS — Z0289 Encounter for other administrative examinations: Secondary | ICD-10-CM

## 2020-06-16 NOTE — Progress Notes (Deleted)
OFFICE VISIT  06/16/2020  CC: No chief complaint on file.   HPI:    Patient is a 49 y.o. Caucasian male who presents for 3 mo f/u paresthesias. A/P as of last visit: "Paresthesias: random locations and can be on either side of his body (trunk as well as face and extremities, but "epicenter" seems to be L upper abd area).  Decent response to gabapentin at 300mg  tid dosing.   Discussed entire w/u thus far, discussed the eval and findings by me, Dr. , and Dr. Noreene Filbert. Extensive blood testing plus imaging all normal. No explanation for his sx's. Discussed options of referral for 2nd neurologic opinion at a tertiary care center vs watchful waiting approach and he chose referral so I ordered this today [facility] neurology). Continue gabapentin 300mg  tid."  INTERIM HX: ***  Past Medical History:  Diagnosis Date  . Allergic rhinitis    Pollen,cat and dog dander, chron allerg conjunctivitis (Dr. [facility] Winkle)--gets allergy immunotherapy injections.  . Anxiety    PMH of  . BPH with obstruction/lower urinary tract symptoms 12/2017   doing well on alfuzosin as of 04/2019 urol f/u  . Contusion of left ankle 04/2018   Xray nl.  Reassured by Ortho (Dr. 05/2019)  . Eosinophilic esophagitis 01/13/2019   Esoph dilation, started PPI 01/2019.  . Mild intermittent asthma   . Numbness and tingling    various areas: MRI brain normal 2021 (Dr. 02/2019)    Past Surgical History:  Procedure Laterality Date  . ESOPHAGOGASTRODUODENOSCOPY  01/29/2019   Eosinophilic esophagitis->esoph dilation  . NCS/EMG  12/2019   R upper and R lower ext's NORMAL (Dr. 01/31/2019)  . VASECTOMY  07/2008   Dr. Frances Furbish    Outpatient Medications Prior to Visit  Medication Sig Dispense Refill  . albuterol (PROVENTIL HFA) 108 (90 Base) MCG/ACT inhaler Inhale 2 puffs into the lungs every 6 (six) hours as needed for wheezing or shortness of breath. 1 Inhaler 1  . alfuzosin (UROXATRAL) 10 MG 24 hr tablet Take 10 mg by mouth  daily. Take 1 daily.    . cetirizine (ZYRTEC) 10 MG tablet Take 10 mg by mouth daily.    08/2008 EPINEPHrine 0.3 mg/0.3 mL IJ SOAJ injection INJ UTD  1  . esomeprazole (NEXIUM) 40 MG capsule TAKE 1 CAPSULE DAILY BEFORE BREAKFAST (CALL FOR AUGUST APPOINTMENT) 90 capsule 3  . fluticasone (FLONASE) 50 MCG/ACT nasal spray Place 1 spray into both nostrils daily. Must see new provider for future refills 16 g 0  . gabapentin (NEURONTIN) 300 MG capsule 1-2 caps tid 180 capsule 2   No facility-administered medications prior to visit.    Allergies  Allergen Reactions  . Zithromax [Azithromycin Dihydrate]     Nausea & vomiting    ROS As per HPI  PE: Vitals with BMI 04/19/2020 03/15/2020 12/07/2019  Height 5\' 9"  5\' 9"  5\' 9"   Weight 150 lbs 8 oz 150 lbs 3 oz 152 lbs  BMI 22.21 22.17 22.44  Systolic 106 110 05/15/2020  Diastolic 78 75 64  Pulse 66 61 65     ***  LABS:  ***  IMPRESSION AND PLAN:  No problem-specific Assessment & Plan notes found for this encounter.   An After Visit Summary was printed and given to the patient.  FOLLOW UP: No follow-ups on file.  @esig @

## 2020-06-23 ENCOUNTER — Ambulatory Visit: Payer: BC Managed Care – PPO | Admitting: Family Medicine

## 2020-06-23 ENCOUNTER — Other Ambulatory Visit: Payer: Self-pay

## 2020-06-23 ENCOUNTER — Encounter: Payer: Self-pay | Admitting: Family Medicine

## 2020-06-23 VITALS — BP 109/70 | HR 55 | Temp 97.8°F | Resp 16 | Ht 69.0 in | Wt 153.6 lb

## 2020-06-23 DIAGNOSIS — R0789 Other chest pain: Secondary | ICD-10-CM

## 2020-06-23 DIAGNOSIS — R0782 Intercostal pain: Secondary | ICD-10-CM

## 2020-06-23 DIAGNOSIS — G629 Polyneuropathy, unspecified: Secondary | ICD-10-CM | POA: Diagnosis not present

## 2020-06-23 DIAGNOSIS — R208 Other disturbances of skin sensation: Secondary | ICD-10-CM

## 2020-06-23 NOTE — Progress Notes (Signed)
OFFICE VISIT  06/23/2020  CC:  Chief Complaint  Patient presents with  . Follow-up    paresthesias    HPI:    Patient is a 49 y.o. Caucasian male who presents for 3 mo f/u paresthesias/dysesthesias. A/P as of last visit: "Paresthesias: random locations and can be on either side of his body (trunk as well as face and extremities, but "epicenter" seems to be L upper abd area).  Decent response to gabapentin at 385m tid dosing.   Discussed entire w/u thus far, discussed the eval and findings by me, Dr. SRosiland Oz and Dr. ARexene Alberts Extensive blood testing plus imaging all normal. No explanation for his sx's. Discussed options of referral for 2nd neurologic opinion at a tertiary care center vs watchful waiting approach and he chose [facility] so [facility] Continue gabapentin 3063mtid."  INTERIM HX: Chronic dysesthesia sensations and diffuse lower chest wall/mid thoracic back pain all seem to be getting worse, affects him daily. Gabapentin helps pretty well at 60061mid dosing and he does not want to increase dose at this time. Stinging, burning, itching type sensation in upper abd a lot, as well as off and on in random other areas of his body.  Gabapentin seems to take care of it well but if he misses dose or is late taking it the sx's emerge quite a bit. His diffuse lower chest and mid back region discomfort/nagging pain is unaffected by any meds.  Seems to get worse when he works.  Still had sx's when [place]. Occ SOB feeling, albut helps this. However, he is able to run 30+miles per week----same as before all of his pain problems the last 1 yr.  This is all affecting his mood. He is depressed, says "not in a funk all the time, though".  No SI or HI. Some hopelessness and anhedonia, feels very frustrated b/c he is pretty young and shouldn't feel this way physically.    [facility]: no fevers, no cough, no dizziness, no HAs, no rashes, no melena/hematochezia.  No polyuria or polydipsia.  No myalgias or arthralgias.  No focal weakness or tremors.  No acute vision or hearing abnormalities. No n/v/d.  No palpitations.     Past Medical History:  Diagnosis Date  . Allergic rhinitis    Pollen,cat and dog dander, chron allerg conjunctivitis (Dr. VanLucianne Leinkle)--gets allergy immunotherapy injections.  . Anxiety    PMH of  . BPH with obstruction/lower urinary tract symptoms 12/2017   doing well on alfuzosin as of 04/2019 urol f/u  . Chronic chest wall pain 2020-2021  . Contusion of left ankle 04/2018   Xray nl.  Reassured by Ortho (Dr. CafFrench Ana. Dysesthesia of multiple sites    various areas: MRI brain normal 2021 (Dr. AthRexene Alberts. Eosinophilic esophagitis 01/15/25/9485Esoph dilation, started PPI 01/2019.  . Mild intermittent asthma   . Thoracic back pain 2020-2021   unknown etiology    Past Surgical History:  Procedure Laterality Date  . ESOPHAGOGASTRODUODENOSCOPY  06/46/27/0350Eosinophilic esophagitis->esoph dilation  . NCS/EMG  12/2019   R upper and R lower ext's NORMAL (Dr. AthRexene Alberts. VASECTOMY  07/2008   Dr. TanGaynelle Arabian Outpatient Medications Prior to Visit  Medication Sig Dispense Refill  . albuterol (PROVENTIL HFA) 108 (90 Base) MCG/ACT inhaler Inhale 2 puffs into the lungs  every 6 (six) hours as needed for wheezing or shortness of breath. 1 Inhaler 1  . alfuzosin (UROXATRAL) 10 MG 24 hr tablet Take 10 mg by mouth daily. Take 1 daily.    . cetirizine (ZYRTEC) 10 MG tablet Take 10 mg by mouth daily.    Marland Kitchen esomeprazole (NEXIUM) 40 MG capsule TAKE 1 CAPSULE DAILY BEFORE BREAKFAST (CALL FOR AUGUST APPOINTMENT) 90 capsule 3  . fluticasone (FLONASE) 50 MCG/ACT nasal spray Place 1 spray into both nostrils daily. Must see new provider for future refills 16 g 0  . gabapentin (NEURONTIN) 300 MG capsule 1-2 caps tid 180 capsule 2  . EPINEPHrine 0.3  mg/0.3 mL IJ SOAJ injection INJ UTD (Patient not taking: Reported on 06/23/2020)  1   No facility-administered medications prior to visit.    Allergies  Allergen Reactions  . Zithromax [Azithromycin Dihydrate]     Nausea & vomiting    ROS As per HPI  PE: Vitals with BMI 06/23/2020 04/19/2020 03/15/2020  Height '5\' 9"'  '5\' 9"'  '5\' 9"'   Weight 153 lbs 10 oz 150 lbs 8 oz 150 lbs 3 oz  BMI 22.67 59.56 38.75  Systolic 643 329 518  Diastolic 70 78 75  Pulse 55 66 61   Gen: Alert, well appearing.  Patient is oriented to person, place, time, and situation. AFFECT: pleasant, lucid thought and speech. CV: RRR, no m/r/g Lungs CTA bilat, nonlabored resps. No chest wall or back tenderness.   Abd: non tender, ND, BS normal.  No HSM or mass.  LABS:  Lab Results  Component Value Date   TSH 1.52 01/05/2019   Lab Results  Component Value Date   WBC 4.9 10/25/2019   HGB 14.0 10/25/2019   HCT 41.9 10/25/2019   MCV 88 10/25/2019   PLT 166 10/25/2019   Lab Results  Component Value Date   CREATININE 1.00 12/07/2019   BUN 13 12/07/2019   NA 143 12/07/2019   K 4.5 12/07/2019   CL 104 12/07/2019   CO2 25 12/07/2019   Lab Results  Component Value Date   ALT 19 12/07/2019   AST 26 12/07/2019   ALKPHOS 87 12/07/2019   BILITOT 0.6 12/07/2019   Lab Results  Component Value Date   CHOL 155 01/05/2019   Lab Results  Component Value Date   HDL 61.90 01/05/2019   Lab Results  Component Value Date   LDLCALC 83 01/05/2019   Lab Results  Component Value Date   TRIG 50.0 01/05/2019   Lab Results  Component Value Date   CHOLHDL 3 01/05/2019   Lab Results  Component Value Date   ESRSEDRATE 2 09/24/2019   Lab Results  Component Value Date   CRP <1 09/24/2019    IMPRESSION AND PLAN:  1) Chronic unrelenting lower chest pain--diffuse/bilat.  Seems to extend around lower thoracic region in his back (L>R) as well.   Unfortunately he finds all of this pretty hard to describe,  understandably. T spine, chest, and L scapula plain films all neg about 11 mo ago when we first started working this up.   Will get CT chest w/contrast to further evaluate. CBC, CMET, ESR, CRP.  2) Dysesthesia of multiple sites; primarily across upper abd/lower chest wall. However, randomly affects other body areas intermittently. Extensive blood w/u has been done over the last year. MRI brain w and w/out by neurologist was normal 12/29/19. Not clear whether his dysesthesia is connected with #1 above----gabapentin helps his dysesthesia but not the pain in #1.  See labs listed in #1 above, plus hep b surface antigen and hep c ab. He has neurologist 2nd opinion set for next month at [facility].  An After Visit Summary was printed and given to the patient.  FOLLOW UP: Return in about 2 months (around 08/23/2020) for f/u dysesthesia, chest pain.  Signed:  Crissie Sickles, MD           06/23/2020

## 2020-06-23 NOTE — Progress Notes (Deleted)
OFFICE VISIT  06/23/2020  CC: No chief complaint on file.   HPI:    Patient is a 49 y.o. Caucasian male who presents for 3 mo f/u paresthesias/dysesthesias. A/P as of last visit: "Paresthesias: random locations and can be on either side of his body (trunk as well as face and extremities, but "epicenter" seems to be L upper abd area).  Decent response to gabapentin at 300mg  tid dosing.   Discussed entire w/u thus far, discussed the eval and findings by me, Dr. , and Dr. Noreene Filbert. Extensive blood testing plus imaging all normal. No explanation for his sx's. Discussed options of referral for 2nd neurologic opinion at a tertiary care center vs watchful waiting approach and he chose referral so I ordered this today [facility] neurolog[facility]). Continue gabapentin 300mg  tid."  INTERIM HX: ***  Past Medical History:  Diagnosis Date  . Allergic rhinitis    Pollen,cat and dog dander, chron allerg conjunctivitis (Dr. [facility] Winkle)--gets allergy immunotherapy injections.  . Anxiety    PMH of  . BPH with obstruction/lower urinary tract symptoms 12/2017   doing well on alfuzosin as of 04/2019 urol f/u  . Contusion of left ankle 04/2018   Xray nl.  Reassured by Ortho (Dr. 05/2019)  . Eosinophilic esophagitis 01/13/2019   Esoph dilation, started PPI 01/2019.  . Mild intermittent asthma   . Numbness and tingling    various areas: MRI brain normal 2021 (Dr. 02/2019)    Past Surgical History:  Procedure Laterality Date  . ESOPHAGOGASTRODUODENOSCOPY  01/29/2019   Eosinophilic esophagitis->esoph dilation  . NCS/EMG  12/2019   R upper and R lower ext's NORMAL (Dr. 01/31/2019)  . VASECTOMY  07/2008   Dr. Frances Furbish    Outpatient Medications Prior to Visit  Medication Sig Dispense Refill  . albuterol (PROVENTIL HFA) 108 (90 Base) MCG/ACT inhaler Inhale 2 puffs into the lungs every 6 (six) hours as needed for wheezing or shortness of breath. 1 Inhaler 1  . alfuzosin (UROXATRAL) 10 MG 24 hr tablet Take 10 mg  by mouth daily. Take 1 daily.    . cetirizine (ZYRTEC) 10 MG tablet Take 10 mg by mouth daily.    08/2008 EPINEPHrine 0.3 mg/0.3 mL IJ SOAJ injection INJ UTD  1  . esomeprazole (NEXIUM) 40 MG capsule TAKE 1 CAPSULE DAILY BEFORE BREAKFAST (CALL FOR AUGUST APPOINTMENT) 90 capsule 3  . fluticasone (FLONASE) 50 MCG/ACT nasal spray Place 1 spray into both nostrils daily. Must see new provider for future refills 16 g 0  . gabapentin (NEURONTIN) 300 MG capsule 1-2 caps tid 180 capsule 2   No facility-administered medications prior to visit.    Allergies  Allergen Reactions  . Zithromax [Azithromycin Dihydrate]     Nausea & vomiting    ROS As per HPI  PE: Vitals with BMI 04/19/2020 03/15/2020 12/07/2019  Height 5\' 9"  5\' 9"  5\' 9"   Weight 150 lbs 8 oz 150 lbs 3 oz 152 lbs  BMI 22.21 22.17 22.44  Systolic 106 110 05/15/2020  Diastolic 78 75 64  Pulse 66 61 65     ***  LABS:  Lab Results  Component Value Date   TSH 1.52 01/05/2019   Lab Results  Component Value Date   WBC 4.9 10/25/2019   HGB 14.0 10/25/2019   HCT 41.9 10/25/2019   MCV 88 10/25/2019   PLT 166 10/25/2019   Lab Results  Component Value Date   CREATININE 1.00 12/07/2019   BUN 13 12/07/2019   NA 143 12/07/2019  K 4.5 12/07/2019   CL 104 12/07/2019   CO2 25 12/07/2019   Lab Results  Component Value Date   ALT 19 12/07/2019   AST 26 12/07/2019   ALKPHOS 87 12/07/2019   BILITOT 0.6 12/07/2019   Lab Results  Component Value Date   CHOL 155 01/05/2019   Lab Results  Component Value Date   HDL 61.90 01/05/2019   Lab Results  Component Value Date   LDLCALC 83 01/05/2019   Lab Results  Component Value Date   TRIG 50.0 01/05/2019   Lab Results  Component Value Date   CHOLHDL 3 01/05/2019    IMPRESSION AND PLAN:  No problem-specific Assessment & Plan notes found for this encounter.   An After Visit Summary was printed and given to the patient.  FOLLOW UP: No follow-ups on file.  Signed:  Santiago Bumpers,  MD           06/23/2020

## 2020-06-26 ENCOUNTER — Telehealth: Payer: Self-pay

## 2020-06-26 LAB — CBC WITH DIFFERENTIAL/PLATELET
Absolute Monocytes: 531 cells/uL (ref 200–950)
Basophils Absolute: 48 cells/uL (ref 0–200)
Basophils Relative: 0.7 %
Eosinophils Absolute: 48 cells/uL (ref 15–500)
Eosinophils Relative: 0.7 %
HCT: 42.7 % (ref 38.5–50.0)
Hemoglobin: 14.5 g/dL (ref 13.2–17.1)
Lymphs Abs: 1373 cells/uL (ref 850–3900)
MCH: 29.1 pg (ref 27.0–33.0)
MCHC: 34 g/dL (ref 32.0–36.0)
MCV: 85.7 fL (ref 80.0–100.0)
MPV: 10.5 fL (ref 7.5–12.5)
Monocytes Relative: 7.7 %
Neutro Abs: 4899 cells/uL (ref 1500–7800)
Neutrophils Relative %: 71 %
Platelets: 214 10*3/uL (ref 140–400)
RBC: 4.98 10*6/uL (ref 4.20–5.80)
RDW: 12.1 % (ref 11.0–15.0)
Total Lymphocyte: 19.9 %
WBC: 6.9 10*3/uL (ref 3.8–10.8)

## 2020-06-26 LAB — COMPREHENSIVE METABOLIC PANEL
AG Ratio: 2.8 (calc) — ABNORMAL HIGH (ref 1.0–2.5)
ALT: 15 U/L (ref 9–46)
AST: 21 U/L (ref 10–40)
Albumin: 4.7 g/dL (ref 3.6–5.1)
Alkaline phosphatase (APISO): 66 U/L (ref 36–130)
BUN: 24 mg/dL (ref 7–25)
CO2: 24 mmol/L (ref 20–32)
Calcium: 9.5 mg/dL (ref 8.6–10.3)
Chloride: 104 mmol/L (ref 98–110)
Creat: 0.99 mg/dL (ref 0.60–1.35)
Globulin: 1.7 g/dL (calc) — ABNORMAL LOW (ref 1.9–3.7)
Glucose, Bld: 98 mg/dL (ref 65–99)
Potassium: 4.3 mmol/L (ref 3.5–5.3)
Sodium: 140 mmol/L (ref 135–146)
Total Bilirubin: 0.8 mg/dL (ref 0.2–1.2)
Total Protein: 6.4 g/dL (ref 6.1–8.1)

## 2020-06-26 LAB — SEDIMENTATION RATE: Sed Rate: 2 mm/h (ref 0–15)

## 2020-06-26 LAB — C-REACTIVE PROTEIN: CRP: 0.4 mg/L (ref <8.0)

## 2020-06-26 LAB — HEPATITIS C ANTIBODY
Hepatitis C Ab: NONREACTIVE
SIGNAL TO CUT-OFF: 0.01 (ref <1.00)

## 2020-06-26 LAB — HEPATITIS B SURFACE ANTIGEN: Hepatitis B Surface Ag: NONREACTIVE

## 2020-06-26 NOTE — Telephone Encounter (Signed)
Patient was contacted regarding lab results, blood tests were all normal. He did inquire about CT scan stating you would look into this for him. Order entered on 11/12 at the time of last visit. I did inform patient due to type of imaging, he is normally contacted with appointment details. The last time I called about a CT, I was informed they were overbooked and short one tech. Unsure how soon you wanted this done.  Please advise, thanks.

## 2020-06-26 NOTE — Telephone Encounter (Signed)
His CT is not urgent.-thx

## 2020-06-27 NOTE — Telephone Encounter (Signed)
Received a referral message stating this requires a peer to peer with [employer] [phone].

## 2020-07-05 DIAGNOSIS — J3089 Other allergic rhinitis: Secondary | ICD-10-CM | POA: Diagnosis not present

## 2020-07-05 DIAGNOSIS — J3081 Allergic rhinitis due to animal (cat) (dog) hair and dander: Secondary | ICD-10-CM | POA: Diagnosis not present

## 2020-07-05 DIAGNOSIS — J301 Allergic rhinitis due to pollen: Secondary | ICD-10-CM | POA: Diagnosis not present

## 2020-07-19 ENCOUNTER — Encounter: Payer: Self-pay | Admitting: Family Medicine

## 2020-07-24 ENCOUNTER — Telehealth: Payer: Self-pay | Admitting: Family Medicine

## 2020-07-24 NOTE — Telephone Encounter (Signed)
Order was entered on 11/12. Please advise, thanks.

## 2020-07-24 NOTE — Telephone Encounter (Signed)
Patient states he has not been contacted yet to schedule imaging that Dr. Milinda Cave ordered at last appt. There is a referral for CT that is marked "Do not schedule" "Not a covered benefit." Please advise.

## 2020-07-25 ENCOUNTER — Ambulatory Visit (HOSPITAL_BASED_OUTPATIENT_CLINIC_OR_DEPARTMENT_OTHER)
Admission: RE | Admit: 2020-07-25 | Discharge: 2020-07-25 | Disposition: A | Discharge status: Home / Self Care | Payer: BC Managed Care – PPO | Source: Ambulatory Visit | Attending: Family Medicine | Admitting: Family Medicine

## 2020-07-25 ENCOUNTER — Other Ambulatory Visit: Payer: Self-pay

## 2020-07-25 DIAGNOSIS — R0782 Intercostal pain: Secondary | ICD-10-CM | POA: Diagnosis not present

## 2020-07-25 DIAGNOSIS — Z6822 Body mass index (BMI) 22.0-22.9, adult: Secondary | ICD-10-CM | POA: Diagnosis not present

## 2020-07-25 DIAGNOSIS — R200 Anesthesia of skin: Secondary | ICD-10-CM | POA: Diagnosis not present

## 2020-07-25 DIAGNOSIS — R202 Paresthesia of skin: Secondary | ICD-10-CM | POA: Diagnosis not present

## 2020-07-25 DIAGNOSIS — R079 Chest pain, unspecified: Secondary | ICD-10-CM | POA: Diagnosis not present

## 2020-07-25 DIAGNOSIS — R0789 Other chest pain: Secondary | ICD-10-CM

## 2020-07-25 MED ORDER — IOHEXOL 300 MG/ML  SOLN
100.0000 mL | Freq: Once | INTRAMUSCULAR | Status: AC | PRN
  Administered 2020-07-25: 75 mL via INTRAVENOUS

## 2020-07-25 NOTE — Telephone Encounter (Signed)
PLS HELP!!  I called to do peer to peer just now and they said the imaging had already been approved BUT the approval period ENDS TODAY! If the CT that I ordered is not done today then we'll have to re-order it and re-do the approval process on line.  Can you call the imaging location and ask if they ever got notification of the test being approved?  Was attempt made by them to contact pt to schedule?  Best case scenario->pt gets this CT scan done today.

## 2020-07-25 NOTE — Telephone Encounter (Signed)
Pt has been scheduled to have CT done today at 6 pm

## 2020-07-28 DIAGNOSIS — J301 Allergic rhinitis due to pollen: Secondary | ICD-10-CM | POA: Diagnosis not present

## 2020-07-28 DIAGNOSIS — J3081 Allergic rhinitis due to animal (cat) (dog) hair and dander: Secondary | ICD-10-CM | POA: Diagnosis not present

## 2020-07-28 DIAGNOSIS — J3089 Other allergic rhinitis: Secondary | ICD-10-CM | POA: Diagnosis not present

## 2020-08-01 DIAGNOSIS — J3089 Other allergic rhinitis: Secondary | ICD-10-CM | POA: Diagnosis not present

## 2020-08-01 DIAGNOSIS — J3081 Allergic rhinitis due to animal (cat) (dog) hair and dander: Secondary | ICD-10-CM | POA: Diagnosis not present

## 2020-08-01 DIAGNOSIS — J301 Allergic rhinitis due to pollen: Secondary | ICD-10-CM | POA: Diagnosis not present

## 2020-08-10 DIAGNOSIS — J301 Allergic rhinitis due to pollen: Secondary | ICD-10-CM | POA: Diagnosis not present

## 2020-08-10 DIAGNOSIS — J3081 Allergic rhinitis due to animal (cat) (dog) hair and dander: Secondary | ICD-10-CM | POA: Diagnosis not present

## 2020-08-10 DIAGNOSIS — J3089 Other allergic rhinitis: Secondary | ICD-10-CM | POA: Diagnosis not present

## 2020-08-15 ENCOUNTER — Telehealth (INDEPENDENT_AMBULATORY_CARE_PROVIDER_SITE_OTHER): Payer: BC Managed Care – PPO | Admitting: Family Medicine

## 2020-08-15 ENCOUNTER — Encounter: Payer: Self-pay | Admitting: Family Medicine

## 2020-08-15 DIAGNOSIS — J209 Acute bronchitis, unspecified: Secondary | ICD-10-CM

## 2020-08-15 MED ORDER — PREDNISONE 20 MG PO TABS: 40.0000 mg | ORAL_TABLET | Freq: Every day | ORAL | 0 refills | Status: DC

## 2020-08-15 MED ORDER — BENZONATATE 200 MG PO CAPS: 200.0000 mg | ORAL_CAPSULE | Freq: Two times a day (BID) | ORAL | 0 refills | Status: DC | PRN

## 2020-08-15 MED ORDER — DOXYCYCLINE HYCLATE 100 MG PO TABS: 100.0000 mg | ORAL_TABLET | Freq: Two times a day (BID) | ORAL | 0 refills | Status: DC

## 2020-08-15 NOTE — Progress Notes (Signed)
VIRTUAL VISIT VIA VIDEO  I connected with Travis Reeves on 08/15/20 at  3:00 PM EST by elemedicine application and verified that I am speaking with the correct person using two identifiers. Location patient: Home Location provider: [facility], Office Persons participating in the virtual visit: Patient, Dr. Claiborne Billings   I discussed the limitations of evaluation and management by telemedicine and the availability of in person appointments. The patient expressed understanding and agreed to proceed.   SUBJECTIVE Chief Complaint  Patient presents with  . Nasal Congestion    Nasal congestion, off and on chest congestion, no fever, cough     HPI: Travis Reeves is a 50 y.o. male present for nasal congestion and cough of 4 weeks duration.  He endorses nasal congestion, cough, sinus pressure that caused his teeth to hurt and headache that started early December. He symptoms hung on and progressed to productive cough, chest congestion. He is taking zyrtec, flonase, mucinex DM and albuterol as needed.  He denies fever,chills, current wheezing or nausea/GI sx.  Moderna covid series completed   (> 6 mos)  ROS: See pertinent positives and negatives per HPI.  Patient Active Problem List   Diagnosis Date Noted  . Dysesthesia 10/25/2019  . Dysuria 09/13/2019  . Scrotal pain 09/13/2019  . Acute left-sided thoracic back pain 08/16/2019  . Eosinophilic esophagitis 01/13/2019  . Finger pain, right 01/02/2016  . Routine general medical examination at a health care facility 11/30/2015  . Hyperlipidemia 06/24/2014  . Rhinoconjunctivitis 03/05/2013    Social History   Tobacco Use  . Smoking status: Never Smoker  . Smokeless tobacco: Never Used  Substance Use Topics  . Alcohol use: Yes    Comment:  occasionally    Current Outpatient Medications:  .  albuterol (PROVENTIL HFA) 108 (90 Base) MCG/ACT inhaler, Inhale 2 puffs into the lungs every 6 (six) hours as needed for wheezing or  shortness of breath., Disp: 1 Inhaler, Rfl: 1 .  alfuzosin (UROXATRAL) 10 MG 24 hr tablet, Take 10 mg by mouth daily. Take 1 daily., Disp: , Rfl:  .  benzonatate (TESSALON) 200 MG capsule, Take 1 capsule (200 mg total) by mouth 2 (two) times daily as needed for cough., Disp: 20 capsule, Rfl: 0 .  cetirizine (ZYRTEC) 10 MG tablet, Take 10 mg by mouth daily., Disp: , Rfl:  .  doxycycline (VIBRA-TABS) 100 MG tablet, Take 1 tablet (100 mg total) by mouth 2 (two) times daily., Disp: 20 tablet, Rfl: 0 .  esomeprazole (NEXIUM) 40 MG capsule, TAKE 1 CAPSULE DAILY BEFORE BREAKFAST (CALL FOR AUGUST APPOINTMENT), Disp: 90 capsule, Rfl: 3 .  fluticasone (FLONASE) 50 MCG/ACT nasal spray, Place 1 spray into both nostrils daily. Must see new provider for future refills, Disp: 16 g, Rfl: 0 .  gabapentin (NEURONTIN) 300 MG capsule, 1-2 caps tid, Disp: 180 capsule, Rfl: 2 .  predniSONE (DELTASONE) 20 MG tablet, Take 2 tablets (40 mg total) by mouth daily with breakfast., Disp: 10 tablet, Rfl: 0  Allergies  Allergen Reactions  . Zithromax [Azithromycin Dihydrate]     Nausea & vomiting    OBJECTIVE: There were no vitals taken for this visit. Gen: No acute distress. Nontoxic in appearance.  HENT: AT. [place].  MMM.  Eyes:Pupils Equal Round Reactive to light, Extraocular movements intact,  Conjunctiva without redness, discharge or icterus. Chest: Cough mild- present. No shortness of breath Skin: no rashes, purpura or petechiae.  Neuro: Alert. Oriented x3   ASSESSMENT AND PLAN: Travis  KAVEH Reeves is a 50 y.o. male present for  Acute bronchitis with symptoms > 10 days Rest, hydrate.  Continue  flonase, zyrtec, mucinex (DM if cough), nettie pot or nasal saline.  Tessalon perles prescribed for cough Doxy BID and prednisone burstprescribed, take until completed.  If cough present it can last up to 6-8 weeks.  F/U 2 weeks if not improved.     Felix Pacini, DO 08/15/2020   Return if symptoms worsen or fail to  improve.  No orders of the defined types were placed in this encounter.  Meds ordered this encounter  Medications  . doxycycline (VIBRA-TABS) 100 MG tablet    Sig: Take 1 tablet (100 mg total) by mouth 2 (two) times daily.    Dispense:  20 tablet    Refill:  0  . predniSONE (DELTASONE) 20 MG tablet    Sig: Take 2 tablets (40 mg total) by mouth daily with breakfast.    Dispense:  10 tablet    Refill:  0  . benzonatate (TESSALON) 200 MG capsule    Sig: Take 1 capsule (200 mg total) by mouth 2 (two) times daily as needed for cough.    Dispense:  20 capsule    Refill:  0   Referral Orders  No referral(s) requested today

## 2020-08-16 DIAGNOSIS — J3081 Allergic rhinitis due to animal (cat) (dog) hair and dander: Secondary | ICD-10-CM | POA: Diagnosis not present

## 2020-08-16 DIAGNOSIS — J301 Allergic rhinitis due to pollen: Secondary | ICD-10-CM | POA: Diagnosis not present

## 2020-08-16 DIAGNOSIS — J3089 Other allergic rhinitis: Secondary | ICD-10-CM | POA: Diagnosis not present

## 2020-08-18 DIAGNOSIS — R3912 Poor urinary stream: Secondary | ICD-10-CM | POA: Diagnosis not present

## 2020-08-18 DIAGNOSIS — N401 Enlarged prostate with lower urinary tract symptoms: Secondary | ICD-10-CM | POA: Diagnosis not present

## 2020-08-31 DIAGNOSIS — R202 Paresthesia of skin: Secondary | ICD-10-CM | POA: Diagnosis not present

## 2020-08-31 DIAGNOSIS — G6289 Other specified polyneuropathies: Secondary | ICD-10-CM | POA: Diagnosis not present

## 2020-08-31 HISTORY — PX: SKIN BIOPSY: SHX1

## 2020-09-01 ENCOUNTER — Encounter: Payer: Self-pay | Admitting: Family Medicine

## 2020-09-05 ENCOUNTER — Encounter: Payer: Self-pay | Admitting: Family Medicine

## 2020-09-08 DIAGNOSIS — J3089 Other allergic rhinitis: Secondary | ICD-10-CM | POA: Diagnosis not present

## 2020-09-08 DIAGNOSIS — J301 Allergic rhinitis due to pollen: Secondary | ICD-10-CM | POA: Diagnosis not present

## 2020-09-08 DIAGNOSIS — J3081 Allergic rhinitis due to animal (cat) (dog) hair and dander: Secondary | ICD-10-CM | POA: Diagnosis not present

## 2020-09-13 ENCOUNTER — Other Ambulatory Visit: Payer: Self-pay | Admitting: Family Medicine

## 2020-09-13 ENCOUNTER — Encounter: Payer: Self-pay | Admitting: Family Medicine

## 2020-09-13 DIAGNOSIS — J3089 Other allergic rhinitis: Secondary | ICD-10-CM | POA: Diagnosis not present

## 2020-09-13 DIAGNOSIS — J3081 Allergic rhinitis due to animal (cat) (dog) hair and dander: Secondary | ICD-10-CM | POA: Diagnosis not present

## 2020-09-22 DIAGNOSIS — J301 Allergic rhinitis due to pollen: Secondary | ICD-10-CM | POA: Diagnosis not present

## 2020-09-22 DIAGNOSIS — J3089 Other allergic rhinitis: Secondary | ICD-10-CM | POA: Diagnosis not present

## 2020-09-22 DIAGNOSIS — J3081 Allergic rhinitis due to animal (cat) (dog) hair and dander: Secondary | ICD-10-CM | POA: Diagnosis not present

## 2020-09-28 DIAGNOSIS — J3089 Other allergic rhinitis: Secondary | ICD-10-CM | POA: Diagnosis not present

## 2020-09-28 DIAGNOSIS — J3081 Allergic rhinitis due to animal (cat) (dog) hair and dander: Secondary | ICD-10-CM | POA: Diagnosis not present

## 2020-09-28 DIAGNOSIS — F451 Undifferentiated somatoform disorder: Secondary | ICD-10-CM | POA: Diagnosis not present

## 2020-09-28 DIAGNOSIS — J301 Allergic rhinitis due to pollen: Secondary | ICD-10-CM | POA: Diagnosis not present

## 2020-10-13 DIAGNOSIS — J301 Allergic rhinitis due to pollen: Secondary | ICD-10-CM | POA: Diagnosis not present

## 2020-10-13 DIAGNOSIS — J3089 Other allergic rhinitis: Secondary | ICD-10-CM | POA: Diagnosis not present

## 2020-10-13 DIAGNOSIS — J3081 Allergic rhinitis due to animal (cat) (dog) hair and dander: Secondary | ICD-10-CM | POA: Diagnosis not present

## 2020-10-26 DIAGNOSIS — J3089 Other allergic rhinitis: Secondary | ICD-10-CM | POA: Diagnosis not present

## 2020-10-26 DIAGNOSIS — J3081 Allergic rhinitis due to animal (cat) (dog) hair and dander: Secondary | ICD-10-CM | POA: Diagnosis not present

## 2020-10-26 DIAGNOSIS — J301 Allergic rhinitis due to pollen: Secondary | ICD-10-CM | POA: Diagnosis not present

## 2020-10-30 DIAGNOSIS — Z1211 Encounter for screening for malignant neoplasm of colon: Secondary | ICD-10-CM | POA: Diagnosis not present

## 2020-10-30 DIAGNOSIS — Z13220 Encounter for screening for lipoid disorders: Secondary | ICD-10-CM | POA: Diagnosis not present

## 2020-10-30 DIAGNOSIS — Z0000 Encounter for general adult medical examination without abnormal findings: Secondary | ICD-10-CM | POA: Diagnosis not present

## 2020-11-02 DIAGNOSIS — J3089 Other allergic rhinitis: Secondary | ICD-10-CM | POA: Diagnosis not present

## 2020-11-02 DIAGNOSIS — J3081 Allergic rhinitis due to animal (cat) (dog) hair and dander: Secondary | ICD-10-CM | POA: Diagnosis not present

## 2020-11-02 DIAGNOSIS — J301 Allergic rhinitis due to pollen: Secondary | ICD-10-CM | POA: Diagnosis not present

## 2020-11-10 DIAGNOSIS — J3081 Allergic rhinitis due to animal (cat) (dog) hair and dander: Secondary | ICD-10-CM | POA: Diagnosis not present

## 2020-11-10 DIAGNOSIS — J3089 Other allergic rhinitis: Secondary | ICD-10-CM | POA: Diagnosis not present

## 2020-11-17 DIAGNOSIS — J3081 Allergic rhinitis due to animal (cat) (dog) hair and dander: Secondary | ICD-10-CM | POA: Diagnosis not present

## 2020-11-17 DIAGNOSIS — J3089 Other allergic rhinitis: Secondary | ICD-10-CM | POA: Diagnosis not present

## 2020-11-17 DIAGNOSIS — J301 Allergic rhinitis due to pollen: Secondary | ICD-10-CM | POA: Diagnosis not present

## 2020-11-27 DIAGNOSIS — J301 Allergic rhinitis due to pollen: Secondary | ICD-10-CM | POA: Diagnosis not present

## 2020-11-27 DIAGNOSIS — J3089 Other allergic rhinitis: Secondary | ICD-10-CM | POA: Diagnosis not present

## 2020-11-27 DIAGNOSIS — J3081 Allergic rhinitis due to animal (cat) (dog) hair and dander: Secondary | ICD-10-CM | POA: Diagnosis not present

## 2020-12-08 DIAGNOSIS — J301 Allergic rhinitis due to pollen: Secondary | ICD-10-CM | POA: Diagnosis not present

## 2020-12-08 DIAGNOSIS — J3081 Allergic rhinitis due to animal (cat) (dog) hair and dander: Secondary | ICD-10-CM | POA: Diagnosis not present

## 2020-12-15 DIAGNOSIS — J3089 Other allergic rhinitis: Secondary | ICD-10-CM | POA: Diagnosis not present

## 2020-12-15 DIAGNOSIS — J3081 Allergic rhinitis due to animal (cat) (dog) hair and dander: Secondary | ICD-10-CM | POA: Diagnosis not present

## 2020-12-20 DIAGNOSIS — J3089 Other allergic rhinitis: Secondary | ICD-10-CM | POA: Diagnosis not present

## 2020-12-20 DIAGNOSIS — J3081 Allergic rhinitis due to animal (cat) (dog) hair and dander: Secondary | ICD-10-CM | POA: Diagnosis not present

## 2020-12-20 DIAGNOSIS — J301 Allergic rhinitis due to pollen: Secondary | ICD-10-CM | POA: Diagnosis not present

## 2020-12-29 DIAGNOSIS — J301 Allergic rhinitis due to pollen: Secondary | ICD-10-CM | POA: Diagnosis not present

## 2020-12-29 DIAGNOSIS — J3089 Other allergic rhinitis: Secondary | ICD-10-CM | POA: Diagnosis not present

## 2020-12-29 DIAGNOSIS — J3081 Allergic rhinitis due to animal (cat) (dog) hair and dander: Secondary | ICD-10-CM | POA: Diagnosis not present

## 2021-01-12 DIAGNOSIS — J3089 Other allergic rhinitis: Secondary | ICD-10-CM | POA: Diagnosis not present

## 2021-01-12 DIAGNOSIS — J3081 Allergic rhinitis due to animal (cat) (dog) hair and dander: Secondary | ICD-10-CM | POA: Diagnosis not present

## 2021-01-12 DIAGNOSIS — J301 Allergic rhinitis due to pollen: Secondary | ICD-10-CM | POA: Diagnosis not present

## 2021-01-18 DIAGNOSIS — J3089 Other allergic rhinitis: Secondary | ICD-10-CM | POA: Diagnosis not present

## 2021-01-18 DIAGNOSIS — J3081 Allergic rhinitis due to animal (cat) (dog) hair and dander: Secondary | ICD-10-CM | POA: Diagnosis not present

## 2021-01-18 DIAGNOSIS — J4530 Mild persistent asthma, uncomplicated: Secondary | ICD-10-CM | POA: Diagnosis not present

## 2021-01-18 DIAGNOSIS — J301 Allergic rhinitis due to pollen: Secondary | ICD-10-CM | POA: Diagnosis not present

## 2021-01-19 DIAGNOSIS — Z6822 Body mass index (BMI) 22.0-22.9, adult: Secondary | ICD-10-CM | POA: Diagnosis not present

## 2021-01-19 DIAGNOSIS — R202 Paresthesia of skin: Secondary | ICD-10-CM | POA: Diagnosis not present

## 2021-01-19 DIAGNOSIS — G629 Polyneuropathy, unspecified: Secondary | ICD-10-CM | POA: Diagnosis not present

## 2021-01-22 ENCOUNTER — Encounter: Payer: Self-pay | Admitting: Family Medicine

## 2021-02-02 DIAGNOSIS — E538 Deficiency of other specified B group vitamins: Secondary | ICD-10-CM | POA: Diagnosis not present

## 2021-02-02 DIAGNOSIS — K219 Gastro-esophageal reflux disease without esophagitis: Secondary | ICD-10-CM | POA: Diagnosis not present

## 2021-02-02 DIAGNOSIS — G629 Polyneuropathy, unspecified: Secondary | ICD-10-CM | POA: Diagnosis not present

## 2021-02-13 IMAGING — DX DG SCAPULA*L*
2 series · 2 of 2 positions shown · non-contrast
Comparison: None.

CLINICAL DATA: Left scapular pain for approximately 2 months. No
known injury.

EXAM:
LEFT SCAPULA - 2+ VIEWS

[scapula ap]
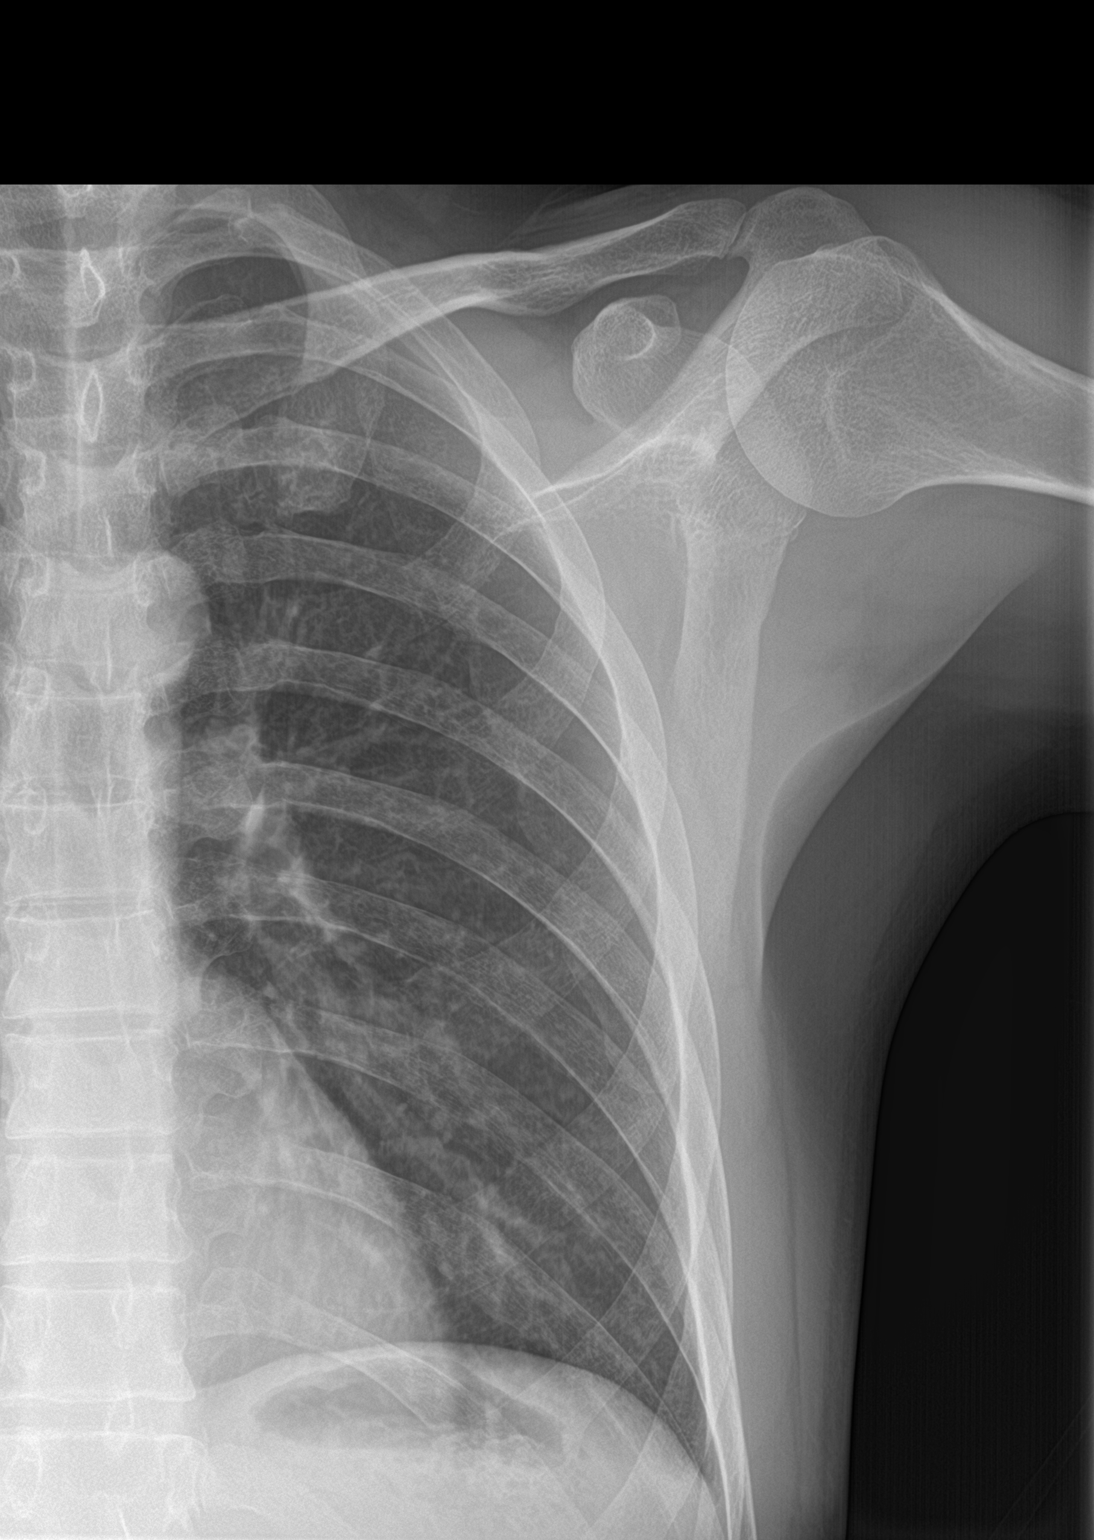

[scapula lat]
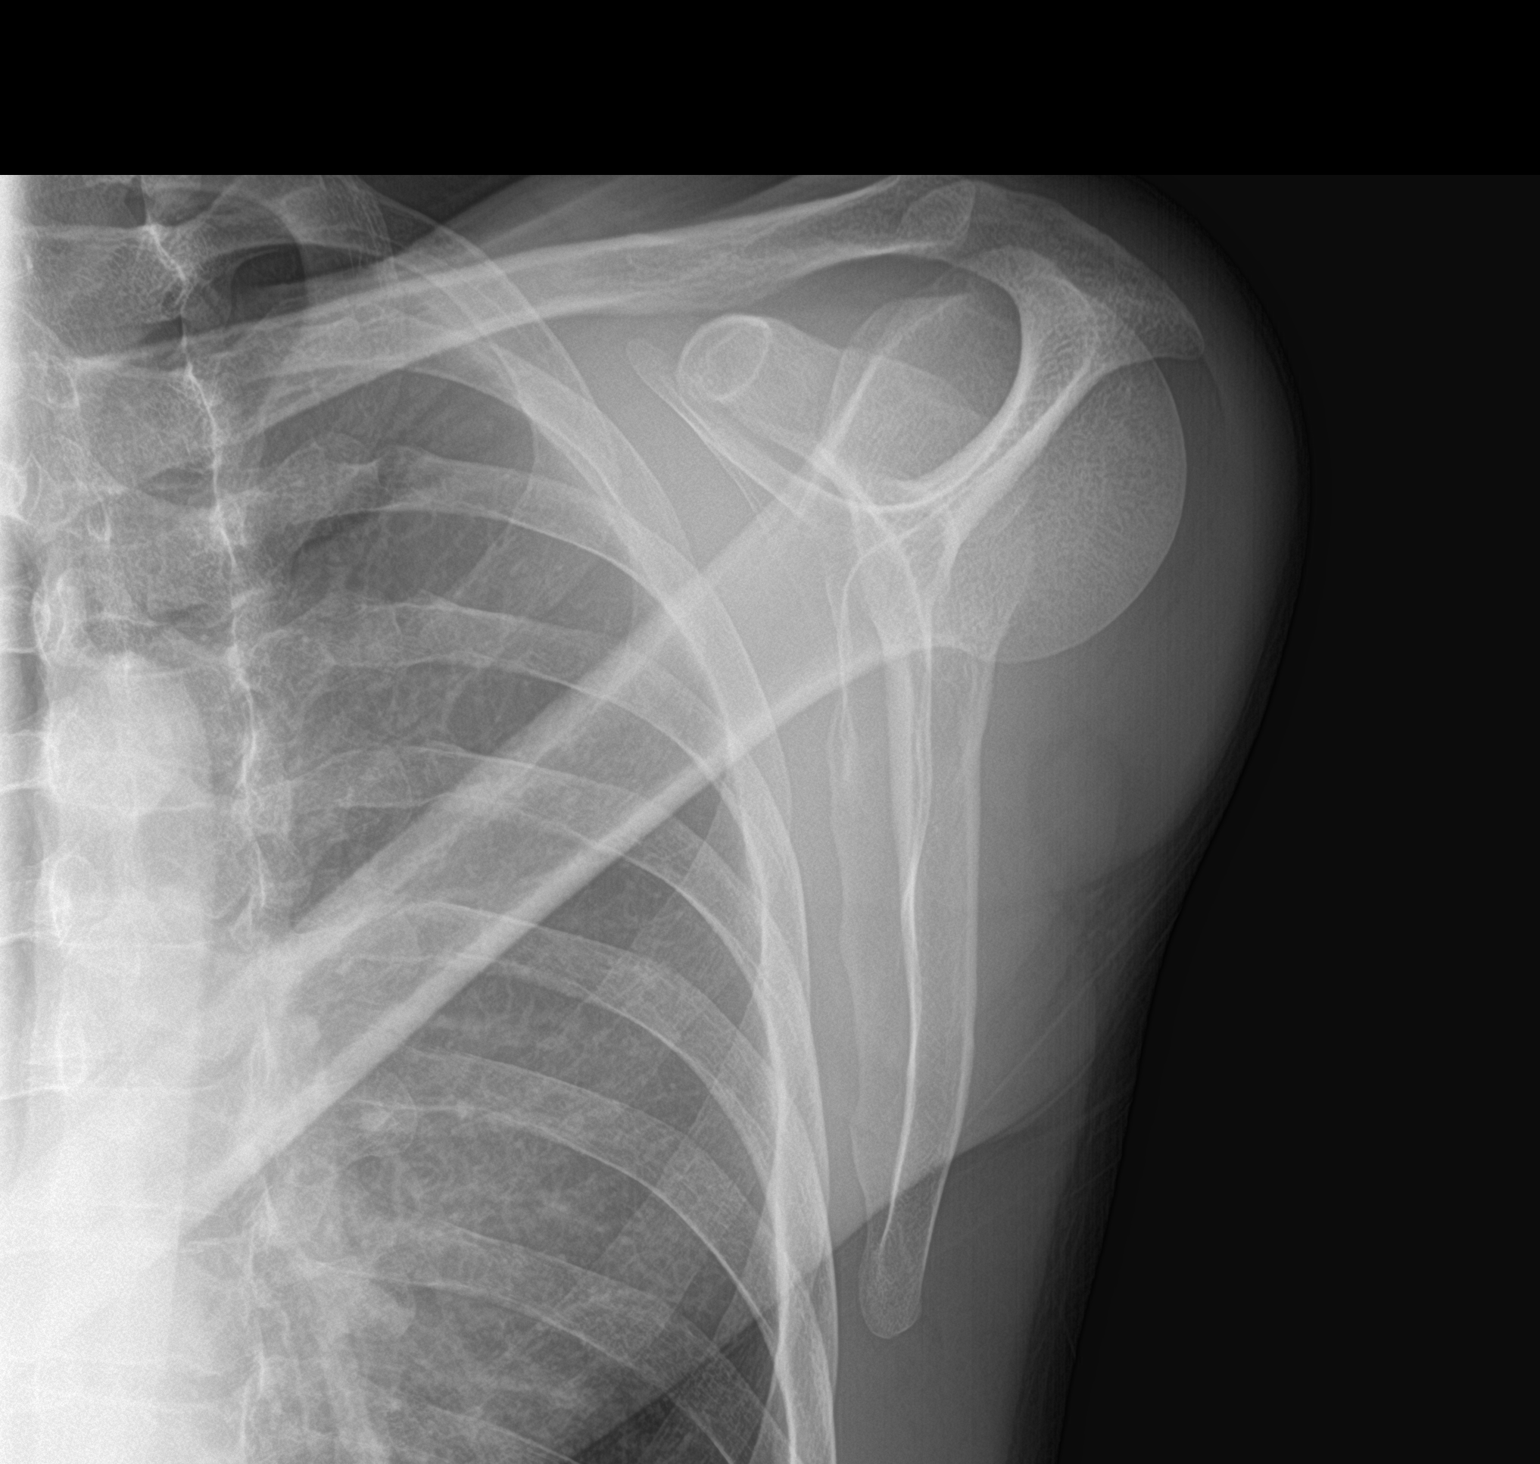

[2 of 2 positions shown; findings below may reference images not displayed]

FINDINGS: There is no evidence of fracture or other focal bone lesions. Soft
tissues are unremarkable.
IMPRESSION: Negative.

## 2021-02-13 IMAGING — DX DG CHEST 2V
2 series · 2 of 2 positions shown · non-contrast
Comparison: 06/14/2011

CLINICAL DATA: Left-sided pleuritic chest pain.  Asthma.

EXAM:
CHEST - 2 VIEW

[chest pa]
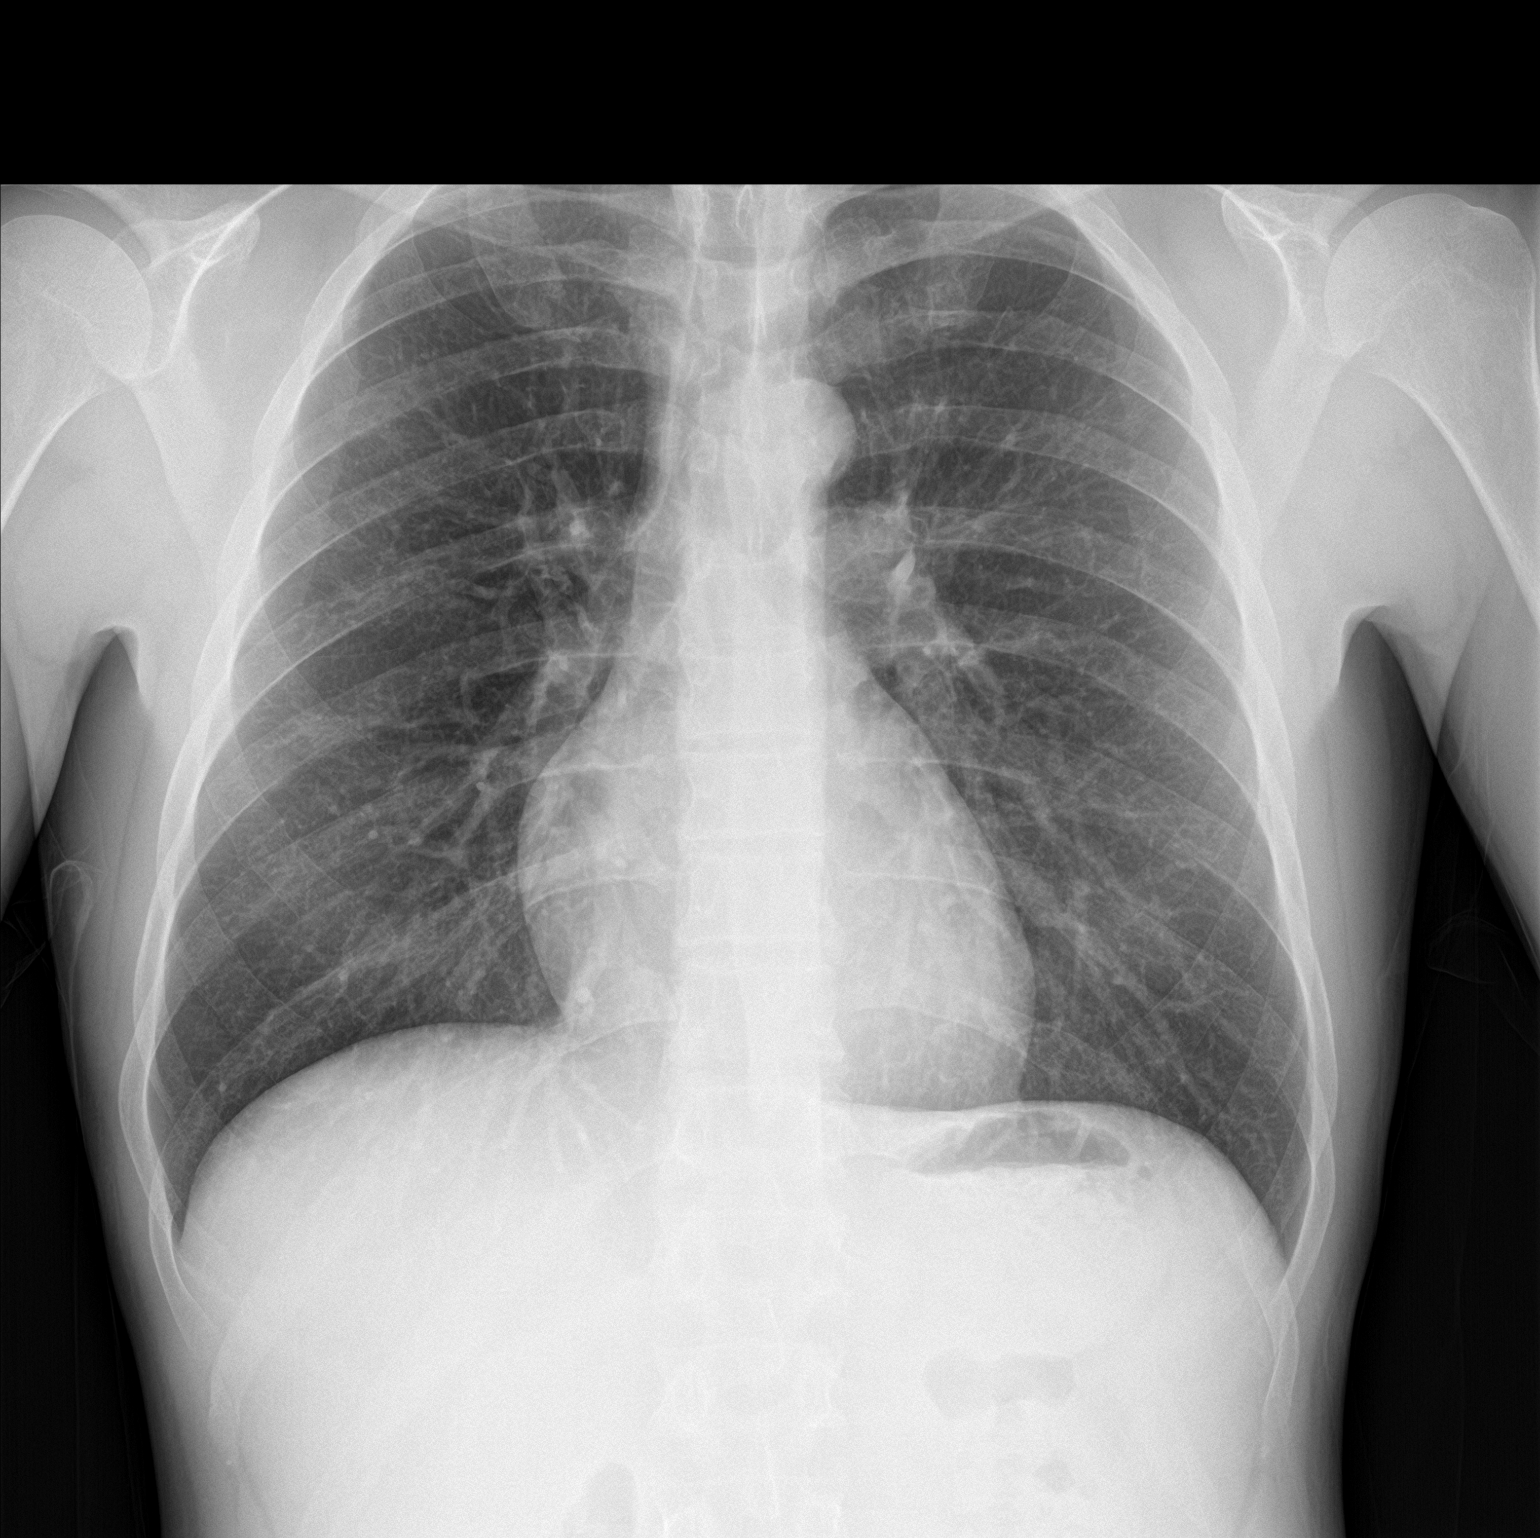

[chest lat]
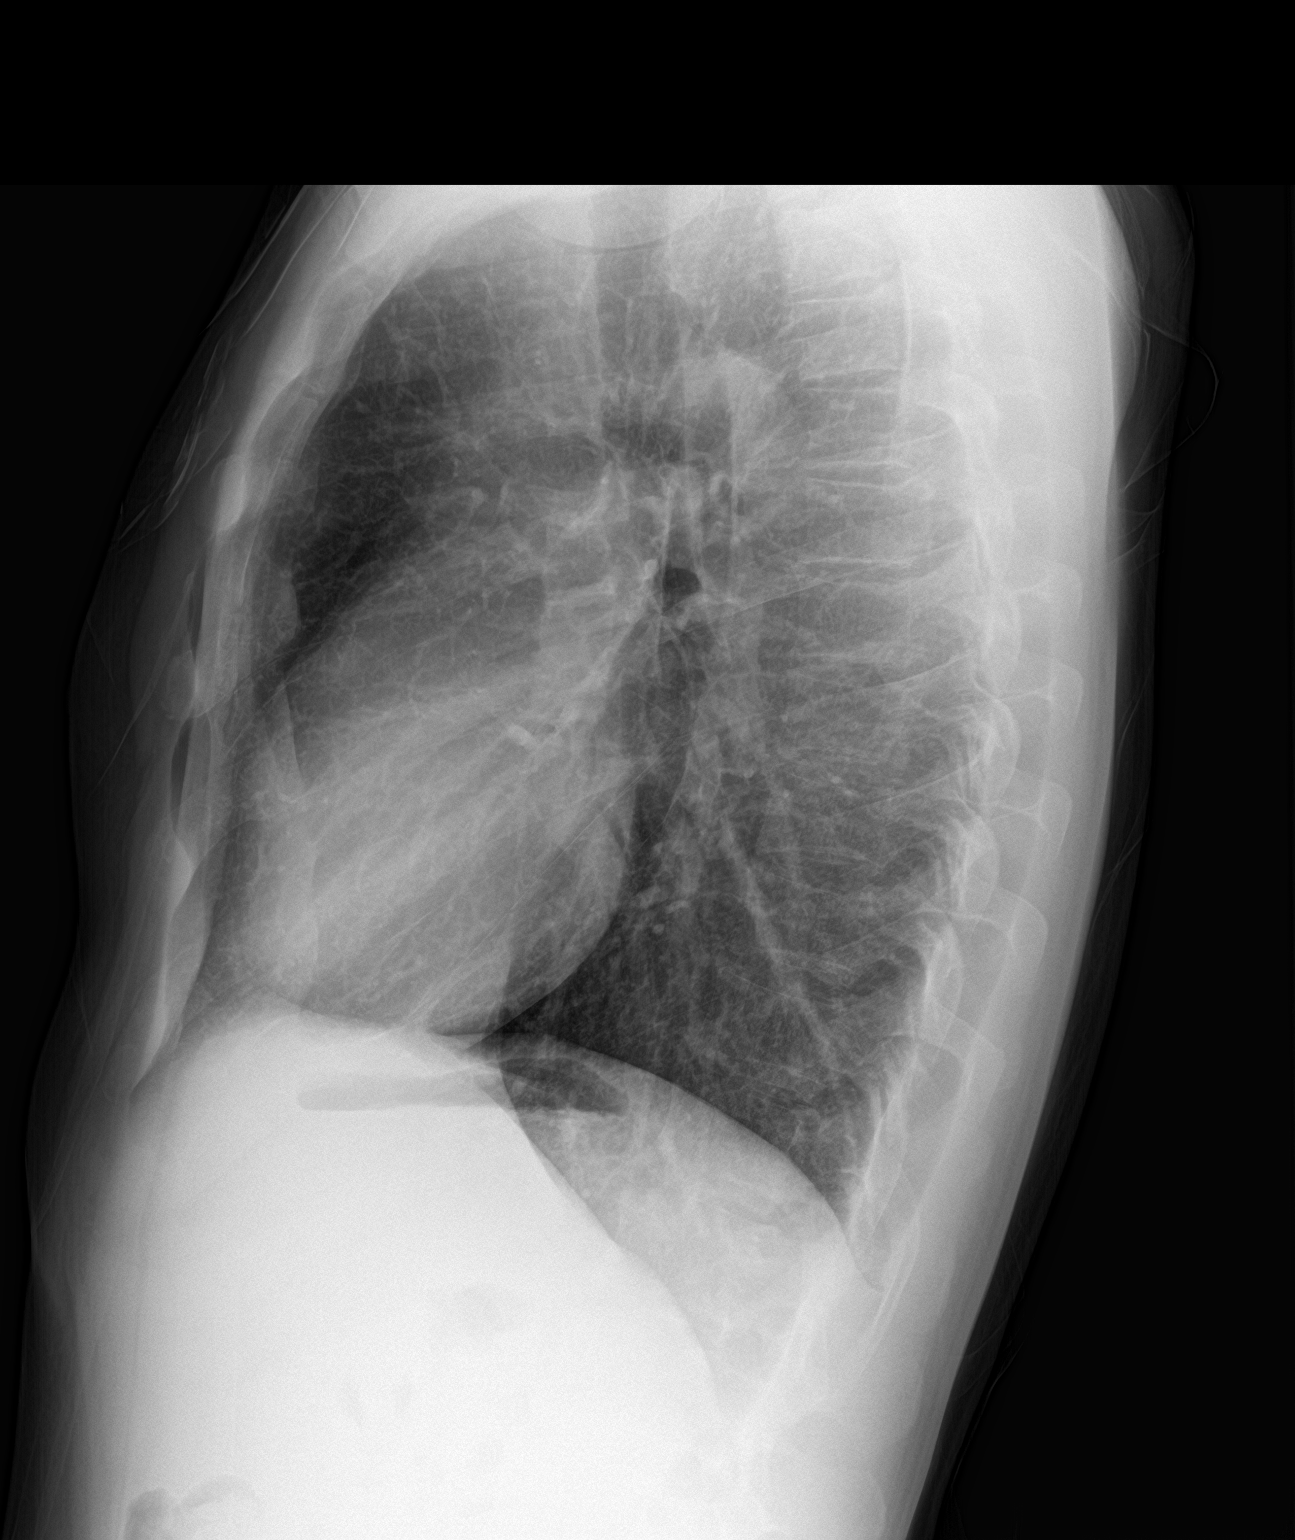

[2 of 2 positions shown; findings below may reference images not displayed]

FINDINGS: The heart size and mediastinal contours are within normal limits.
Both lungs are clear. The visualized skeletal structures are
unremarkable.
IMPRESSION: No active cardiopulmonary disease.

## 2021-02-22 DIAGNOSIS — J3089 Other allergic rhinitis: Secondary | ICD-10-CM | POA: Diagnosis not present

## 2021-02-22 DIAGNOSIS — J3081 Allergic rhinitis due to animal (cat) (dog) hair and dander: Secondary | ICD-10-CM | POA: Diagnosis not present

## 2021-02-22 DIAGNOSIS — J301 Allergic rhinitis due to pollen: Secondary | ICD-10-CM | POA: Diagnosis not present

## 2021-02-23 DIAGNOSIS — D123 Benign neoplasm of transverse colon: Secondary | ICD-10-CM | POA: Diagnosis not present

## 2021-02-23 DIAGNOSIS — K449 Diaphragmatic hernia without obstruction or gangrene: Secondary | ICD-10-CM | POA: Diagnosis not present

## 2021-02-23 DIAGNOSIS — K2950 Unspecified chronic gastritis without bleeding: Secondary | ICD-10-CM | POA: Diagnosis not present

## 2021-02-23 DIAGNOSIS — Z8371 Family history of colonic polyps: Secondary | ICD-10-CM | POA: Diagnosis not present

## 2021-02-23 DIAGNOSIS — Z1211 Encounter for screening for malignant neoplasm of colon: Secondary | ICD-10-CM | POA: Diagnosis not present

## 2021-02-23 DIAGNOSIS — D128 Benign neoplasm of rectum: Secondary | ICD-10-CM | POA: Diagnosis not present

## 2021-02-23 DIAGNOSIS — R0789 Other chest pain: Secondary | ICD-10-CM | POA: Diagnosis not present

## 2021-03-08 DIAGNOSIS — E538 Deficiency of other specified B group vitamins: Secondary | ICD-10-CM | POA: Diagnosis not present

## 2021-03-09 DIAGNOSIS — J3089 Other allergic rhinitis: Secondary | ICD-10-CM | POA: Diagnosis not present

## 2021-03-09 DIAGNOSIS — J3081 Allergic rhinitis due to animal (cat) (dog) hair and dander: Secondary | ICD-10-CM | POA: Diagnosis not present

## 2021-03-09 DIAGNOSIS — J301 Allergic rhinitis due to pollen: Secondary | ICD-10-CM | POA: Diagnosis not present

## 2021-03-15 DIAGNOSIS — J301 Allergic rhinitis due to pollen: Secondary | ICD-10-CM | POA: Diagnosis not present

## 2021-03-15 DIAGNOSIS — J3081 Allergic rhinitis due to animal (cat) (dog) hair and dander: Secondary | ICD-10-CM | POA: Diagnosis not present

## 2021-03-15 DIAGNOSIS — J3089 Other allergic rhinitis: Secondary | ICD-10-CM | POA: Diagnosis not present

## 2021-03-21 DIAGNOSIS — D124 Benign neoplasm of descending colon: Secondary | ICD-10-CM | POA: Diagnosis not present

## 2021-03-21 DIAGNOSIS — D128 Benign neoplasm of rectum: Secondary | ICD-10-CM | POA: Diagnosis not present

## 2021-03-21 DIAGNOSIS — Z86010 Personal history of colonic polyps: Secondary | ICD-10-CM | POA: Diagnosis not present

## 2021-03-21 DIAGNOSIS — D123 Benign neoplasm of transverse colon: Secondary | ICD-10-CM | POA: Diagnosis not present

## 2021-03-23 DIAGNOSIS — J3081 Allergic rhinitis due to animal (cat) (dog) hair and dander: Secondary | ICD-10-CM | POA: Diagnosis not present

## 2021-03-23 DIAGNOSIS — J3089 Other allergic rhinitis: Secondary | ICD-10-CM | POA: Diagnosis not present

## 2021-03-23 DIAGNOSIS — J301 Allergic rhinitis due to pollen: Secondary | ICD-10-CM | POA: Diagnosis not present

## 2021-03-27 DIAGNOSIS — J3089 Other allergic rhinitis: Secondary | ICD-10-CM | POA: Diagnosis not present

## 2021-03-27 DIAGNOSIS — J3081 Allergic rhinitis due to animal (cat) (dog) hair and dander: Secondary | ICD-10-CM | POA: Diagnosis not present

## 2021-03-27 DIAGNOSIS — J301 Allergic rhinitis due to pollen: Secondary | ICD-10-CM | POA: Diagnosis not present

## 2021-04-02 ENCOUNTER — Other Ambulatory Visit: Payer: Self-pay | Admitting: Internal Medicine

## 2021-04-06 DIAGNOSIS — J3089 Other allergic rhinitis: Secondary | ICD-10-CM | POA: Diagnosis not present

## 2021-04-06 DIAGNOSIS — J301 Allergic rhinitis due to pollen: Secondary | ICD-10-CM | POA: Diagnosis not present

## 2021-04-06 DIAGNOSIS — J3081 Allergic rhinitis due to animal (cat) (dog) hair and dander: Secondary | ICD-10-CM | POA: Diagnosis not present

## 2021-04-19 ENCOUNTER — Other Ambulatory Visit: Payer: Self-pay

## 2021-04-19 ENCOUNTER — Ambulatory Visit (INDEPENDENT_AMBULATORY_CARE_PROVIDER_SITE_OTHER): Payer: BC Managed Care – PPO | Admitting: Behavioral Health

## 2021-04-19 ENCOUNTER — Encounter: Payer: Self-pay | Admitting: Behavioral Health

## 2021-04-19 VITALS — BP 119/76 | HR 54 | Ht 69.0 in | Wt 155.0 lb

## 2021-04-19 DIAGNOSIS — F4542 Pain disorder with related psychological factors: Secondary | ICD-10-CM | POA: Diagnosis not present

## 2021-04-19 DIAGNOSIS — F411 Generalized anxiety disorder: Secondary | ICD-10-CM

## 2021-04-19 DIAGNOSIS — F330 Major depressive disorder, recurrent, mild: Secondary | ICD-10-CM

## 2021-04-19 MED ORDER — DULOXETINE HCL 20 MG PO CPEP: ORAL_CAPSULE | ORAL | 2 refills | Status: DC

## 2021-04-19 MED ORDER — SERTRALINE HCL 50 MG PO TABS: ORAL_TABLET | ORAL | 1 refills | Status: DC

## 2021-04-19 MED ORDER — AMITRIPTYLINE HCL 50 MG PO TABS: ORAL_TABLET | ORAL | 1 refills | Status: DC

## 2021-04-19 NOTE — Progress Notes (Signed)
[facility] MD/PA/NP Initial Note  04/19/2021 11:59 AM Travis Reeves  MRN:  [id number]  Chief Complaint:   HPI:  50 year old male presents to this office for initial visit and to establish care. He says that he is here today because of unexplainable body pain but more distinctly mid-sternal pain. Says that he is been to every type of specialist including cardiology and they can find nothing wrong. He says that he believes he is OCD and is now wondering if the pain is psychological or turned into obsessional thoughts. He says his wife believes that the pain get worse when he feels stress or more anxiety. He says he is desperate because there has been no other explanation.  He says his anxiety today is 4/10 and depression is 2/10. He says that he sleeps 7-8 hours per night. 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 No mania, no psychosis, no SI/HI. He is willing to try psychotropic medication to see if it helps relieve symptoms.   No prior medication trials:   Visit Diagnosis:    ICD-10-CM   1. Generalized anxiety disorder  F41.1     2. Mild episode of recurrent major depressive disorder (HCC)  F33.0       Past Psychiatric History: none  Past Medical History:  Past Medical History:  Diagnosis Date   Allergic rhinitis    Pollen,cat and dog dander, chron allerg conjunctivitis (Dr. Zenaida Niece Winkle)--gets allergy immunotherapy injections.   Anxiety    PMH of   BPH with obstruction/lower urinary tract symptoms 12/2017   doing well on alfuzosin as of 08/2020 urol f/u. (also hx of epididymitis and L hydrocele 2014)   Chronic chest wall pain 2020-2021   Contusion of left ankle 04/2018   Xray nl.  Reassured by Ortho (Dr. Madelon Lips)   Dysesthesia of multiple sites    various areas: MRI brain normal 2021 (Dr. Frances Furbish). [facility] neurology dx'd hime with small fiber neuropathy on skin bx 08/2020->gabapentin   Eosinophilic esophagitis 01/13/2019   Esoph dilation, started PPI 01/2019.   Mild  intermittent asthma    Thoracic back pain 2020-2021   unknown etiology    Past Surgical History:  Procedure Laterality Date   ESOPHAGOGASTRODUODENOSCOPY  01/29/2019   Eosinophilic esophagitis->esoph dilation   NCS/EMG  12/2019   R upper and R lower ext's NORMAL (Dr. Frances Furbish)   SKIN BIOPSY  08/31/2020   [facility] Neurology) R distal leg and thigh, for suspected small fiber neuropathy->sent for epidermal nerve fiber density and reflex sweat gland nerve fiber density testing   VASECTOMY  07/2008   Dr. Patsi Sears    Family Psychiatric History: see chart  Family History:  Family History  Problem Relation Age of Onset   Hypertension Mother    Hyperlipidemia Mother    Diabetes Mother    Migraines Mother    Pancreatic cancer Mother        died 49   Depression Father    Arthritis Father    Hypertension Father    Hyperlipidemia Father    Heart attack Father        > 52; S/P 2 stents   Diabetes Father    Colon polyps Father    Cancer Paternal Grandmother        colon & stomach   Transient ischemic attack Paternal Grandfather    Breast cancer Paternal Aunt    Allergy (severe) Son        on shots   Esophageal cancer Neg  Hx     Social History:  Social History   Socioeconomic History   Marital status: Married    Spouse name: Not on file   Number of children: 2   Years of education: 16   Highest education level: Not on file  Occupational History   Occupation: Airline pilot Rep  Tobacco Use   Smoking status: Never   Smokeless tobacco: Never  Vaping Use   Vaping Use: Never used  Substance and Sexual Activity   Alcohol use: Yes    Comment:  occasionally   Drug use: No   Sexual activity: Yes    Birth control/protection: Surgical  Other Topics Concern   Not on file  Social History Narrative   Married, 2 boys.   Educ: Bachelors in agricultural business.   Occup: Scientist, water quality.   No tob, some weekend alcohol.  No drugs.   Fun: Golf and exercise (running).   Social  Determinants of Health   Financial Resource Strain: Not on file  Food Insecurity: Not on file  Transportation Needs: Not on file  Physical Activity: Not on file  Stress: Not on file  Social Connections: Not on file    Allergies:  Allergies  Allergen Reactions   Zithromax [Azithromycin Dihydrate]     Nausea & vomiting    Metabolic Disorder Labs: No results found for: HGBA1C, MPG No results found for: PROLACTIN Lab Results  Component Value Date   CHOL 155 01/05/2019   TRIG 50.0 01/05/2019   HDL 61.90 01/05/2019   CHOLHDL 3 01/05/2019   VLDL 10.0 01/05/2019   LDLCALC 83 01/05/2019   LDLCALC 87 01/01/2018   Lab Results  Component Value Date   TSH 1.52 01/05/2019   TSH 1.53 01/01/2018    Therapeutic Level Labs: No results found for: LITHIUM No results found for: VALPROATE No components found for:  CBMZ  Current Medications: Current Outpatient Medications  Medication Sig Dispense Refill   alfuzosin (UROXATRAL) 10 MG 24 hr tablet Take 10 mg by mouth daily. Take 1 daily.     cetirizine (ZYRTEC) 10 MG tablet Take 10 mg by mouth daily.     esomeprazole (NEXIUM) 40 MG capsule Take 1 capsule (40 mg total) by mouth daily before breakfast. 90 capsule 3   fluticasone (FLONASE) 50 MCG/ACT nasal spray Place 1 spray into both nostrils daily. Must see new provider for future refills 16 g 0   gabapentin (NEURONTIN) 300 MG capsule TAKE 1 TO 2 CAPSULES BY MOUTH 3 TIMES A DAY 180 capsule 2   albuterol (PROVENTIL HFA) 108 (90 Base) MCG/ACT inhaler Inhale 2 puffs into the lungs every 6 (six) hours as needed for wheezing or shortness of breath. (Patient not taking: Reported on 04/19/2021) 1 Inhaler 1   benzonatate (TESSALON) 200 MG capsule Take 1 capsule (200 mg total) by mouth 2 (two) times daily as needed for cough. (Patient not taking: Reported on 04/19/2021) 20 capsule 0   doxycycline (VIBRA-TABS) 100 MG tablet Take 1 tablet (100 mg total) by mouth 2 (two) times daily. (Patient not taking:  Reported on 04/19/2021) 20 tablet 0   EPINEPHrine 0.3 mg/0.3 mL IJ SOAJ injection SMARTSIG:Injection IM As Directed     predniSONE (DELTASONE) 20 MG tablet Take 2 tablets (40 mg total) by mouth daily with breakfast. (Patient not taking: Reported on 04/19/2021) 10 tablet 0   SYMBICORT 80-4.5 MCG/ACT inhaler Inhale into the lungs.     No current facility-administered medications for this visit.    Medication Side Effects: none  Orders placed this visit:  No orders of the defined types were placed in this encounter.   Psychiatric Specialty Exam:  Review of Systems  Constitutional: Negative.   HENT: Negative.    Musculoskeletal:  Negative for gait problem.  Allergic/Immunologic: Negative.   Neurological: Negative.  Negative for tremors.  Psychiatric/Behavioral:  Positive for dysphoric mood. The patient is nervous/anxious.    There were no vitals taken for this visit.There is no height or weight on file to calculate BMI.  General Appearance: Casual, Neat, and Well Groomed  Eye Contact:  Good  Speech:  Clear and Coherent  Volume:  Normal  Mood:  Anxious  Affect:  Anxious  Thought Process:  Coherent  Orientation:  Full (Time, Place, and Person)  Thought Content: Logical   Suicidal Thoughts:  No  Homicidal Thoughts:  No  Memory:  WNL  Judgement:  Good  Insight:  Good  Psychomotor Activity:  Normal  Concentration:  Concentration: Good  Recall:  Good  Fund of Knowledge: Good  Language: Good  Assets:  Desire for Improvement  ADL's:  Intact  Cognition: WNL  Prognosis:  Good   Screenings:  PHQ2-9    Flowsheet Row Office Visit from 01/05/2019 in [facility] Visit from 12/26/2016 in [facility] Visit from 11/30/2015 in [facility]  PHQ-2 Total Score 0 0 0       Receiving Psychotherapy: No   Treatment Plan/Recommendations:  Greater than 50% of 60 min  face to face time with patient was spent on  counseling and coordination of care. We discussed his history of body pain without obtaining a medical dx or explanation of pain.  To start Cymbalta 20 mg twice daily To start Amitriptyline 25 mg for 7 days, then 50 mg daily at  bedtime. To report adverse symptoms or worsening symptoms Will follow up in 4 weeks to reassess. Provided emergency contact information. Reviewed PDMP      Joan Flores, NP

## 2021-04-27 DIAGNOSIS — J3081 Allergic rhinitis due to animal (cat) (dog) hair and dander: Secondary | ICD-10-CM | POA: Diagnosis not present

## 2021-04-27 DIAGNOSIS — J301 Allergic rhinitis due to pollen: Secondary | ICD-10-CM | POA: Diagnosis not present

## 2021-04-27 DIAGNOSIS — J3089 Other allergic rhinitis: Secondary | ICD-10-CM | POA: Diagnosis not present

## 2021-05-03 DIAGNOSIS — J3081 Allergic rhinitis due to animal (cat) (dog) hair and dander: Secondary | ICD-10-CM | POA: Diagnosis not present

## 2021-05-03 DIAGNOSIS — J3089 Other allergic rhinitis: Secondary | ICD-10-CM | POA: Diagnosis not present

## 2021-05-03 DIAGNOSIS — J301 Allergic rhinitis due to pollen: Secondary | ICD-10-CM | POA: Diagnosis not present

## 2021-05-14 DIAGNOSIS — J301 Allergic rhinitis due to pollen: Secondary | ICD-10-CM | POA: Diagnosis not present

## 2021-05-14 DIAGNOSIS — J3081 Allergic rhinitis due to animal (cat) (dog) hair and dander: Secondary | ICD-10-CM | POA: Diagnosis not present

## 2021-05-14 DIAGNOSIS — J3089 Other allergic rhinitis: Secondary | ICD-10-CM | POA: Diagnosis not present

## 2021-05-15 ENCOUNTER — Telehealth: Payer: Self-pay

## 2021-05-15 NOTE — Telephone Encounter (Signed)
PA sent via covermymed on 05/12/21   Key: [id number]   Medication: Gabapentin 300MG    Dx: G62.9 - Polyneuropathy, unspecified, R20.8 - Other disturbances of skin sensation   Per Dr. pt has tried and failed n/a   Waiting for response.    Coverage Start Date:04/14/2021;Coverage End Date:05/14/2022

## 2021-05-18 ENCOUNTER — Encounter: Payer: Self-pay | Admitting: Behavioral Health

## 2021-05-18 ENCOUNTER — Ambulatory Visit (INDEPENDENT_AMBULATORY_CARE_PROVIDER_SITE_OTHER): Payer: BC Managed Care – PPO | Admitting: Behavioral Health

## 2021-05-18 ENCOUNTER — Other Ambulatory Visit: Payer: Self-pay

## 2021-05-18 DIAGNOSIS — F4542 Pain disorder with related psychological factors: Secondary | ICD-10-CM | POA: Diagnosis not present

## 2021-05-18 DIAGNOSIS — F330 Major depressive disorder, recurrent, mild: Secondary | ICD-10-CM | POA: Diagnosis not present

## 2021-05-18 DIAGNOSIS — F411 Generalized anxiety disorder: Secondary | ICD-10-CM

## 2021-05-18 MED ORDER — DULOXETINE HCL 20 MG PO CPEP
ORAL_CAPSULE | ORAL | 3 refills | Status: DC
Start: 2021-05-18 — End: 2021-08-15

## 2021-05-18 MED ORDER — GABAPENTIN 300 MG PO CAPS: ORAL_CAPSULE | ORAL | 3 refills | Status: DC

## 2021-05-18 MED ORDER — AMITRIPTYLINE HCL 50 MG PO TABS: ORAL_TABLET | ORAL | 3 refills | Status: DC

## 2021-05-18 NOTE — Progress Notes (Signed)
[facility]  Patient ID: Travis Reeves,  MRN: [id number]  PCP: Jeoffrey Massed, MD  Date of Evaluation: 05/18/2021 Time spent:20 minutes  Chief Complaint:  Chief Complaint   Follow-up; Medication Refill; Depression; Anxiety; pain     HISTORY/CURRENT STATUS: HPI  50 year old male presents to this office for follow up and medication management.  He says, "I am feeling great, the medication has been doing wonderful and my pain has subsided. He says that he is very please and doesn't not need any medication adjustments at this time but will need refills. Says that he wished he had sought treatment earlier. Says his wife has noticed the difference in how he feels. He is very surprised that nothing else has helped until now. He says his anxiety is 2/10 and depression is 0/10.   He is sleeping 7-8 hours per night. 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 No mania, no psychosis, no SI/HI. Agrees to follow up in 3 months.  No prior medication trials:      Individual Medical History/ Review of Systems: Changes? :No   Allergies: Zithromax [azithromycin dihydrate]  Current Medications:  Current Outpatient Medications:    alfuzosin (UROXATRAL) 10 MG 24 hr tablet, Take 10 mg by mouth daily. Take 1 daily., Disp: , Rfl:    cetirizine (ZYRTEC) 10 MG tablet, Take 10 mg by mouth daily., Disp: , Rfl:    EPINEPHrine 0.3 mg/0.3 mL IJ SOAJ injection, SMARTSIG:Injection IM As Directed, Disp: , Rfl:    esomeprazole (NEXIUM) 40 MG capsule, Take 1 capsule (40 mg total) by mouth daily before breakfast., Disp: 90 capsule, Rfl: 3   fluticasone (FLONASE) 50 MCG/ACT nasal spray, Place 1 spray into both nostrils daily. Must see new provider for future refills, Disp: 16 g, Rfl: 0   SYMBICORT 80-4.5 MCG/ACT inhaler, Inhale into the lungs., Disp: , Rfl:    albuterol (PROVENTIL HFA) 108 (90 Base) MCG/ACT inhaler, Inhale 2 puffs into the lungs every 6 (six) hours as needed for  wheezing or shortness of breath. (Patient not taking: Reported on 04/19/2021), Disp: 1 Inhaler, Rfl: 1   amitriptyline (ELAVIL) 50 MG tablet, Take one tablet daily., Disp: 30 tablet, Rfl: 3   benzonatate (TESSALON) 200 MG capsule, Take 1 capsule (200 mg total) by mouth 2 (two) times daily as needed for cough. (Patient not taking: No sig reported), Disp: 20 capsule, Rfl: 0   doxycycline (VIBRA-TABS) 100 MG tablet, Take 1 tablet (100 mg total) by mouth 2 (two) times daily. (Patient not taking: No sig reported), Disp: 20 tablet, Rfl: 0   DULoxetine (CYMBALTA) 20 MG capsule, Take one capsule after breakfast and one after dinner for total of 40 mg daily., Disp: 60 capsule, Rfl: 3   gabapentin (NEURONTIN) 300 MG capsule, TAKE 1 TO 2 CAPSULES BY MOUTH 3 TIMES A DAY, Disp: 180 capsule, Rfl: 3   predniSONE (DELTASONE) 20 MG tablet, Take 2 tablets (40 mg total) by mouth daily with breakfast. (Patient not taking: No sig reported), Disp: 10 tablet, Rfl: 0 Medication Side Effects: none  Family Medical/ Social History: Changes? No  MENTAL HEALTH EXAM:  There were no vitals taken for this visit.There is no height or weight on file to calculate BMI.  General Appearance: Casual, Neat, and Well Groomed  Eye Contact:  Good  Speech:  Clear and Coherent  Volume:  Normal  Mood:  NA  Affect:  Appropriate  Thought Process:   Normal   Orientation:  Full (Time, Place, and Person)  Thought Content: Logical   Suicidal Thoughts:  No  Homicidal Thoughts:  No  Memory:  WNL  Judgement:  Good  Insight:  Good  Psychomotor Activity:  Normal  Concentration:  Concentration: Good  Recall:  Good  Fund of Knowledge: Good  Language: Good  Assets:  Desire for Improvement  ADL's:  Intact  Cognition: WNL  Prognosis:  Good    DIAGNOSES:    ICD-10-CM   1. Generalized anxiety disorder  F41.1 amitriptyline (ELAVIL) 50 MG tablet    DULoxetine (CYMBALTA) 20 MG capsule    gabapentin (NEURONTIN) 300 MG capsule    2. Mild  episode of recurrent major depressive disorder (HCC)  F33.0 amitriptyline (ELAVIL) 50 MG tablet    DULoxetine (CYMBALTA) 20 MG capsule    gabapentin (NEURONTIN) 300 MG capsule    3. Pain disorders related to psychological factors  F45.42 amitriptyline (ELAVIL) 50 MG tablet    DULoxetine (CYMBALTA) 20 MG capsule    gabapentin (NEURONTIN) 300 MG capsule      Receiving Psychotherapy: No    RECOMMENDATIONS:   Treatment Plan/Recommendations:   Greater than 50% of 20  min  face to face time with patient was spent on counseling and coordination of care. We reviewed  his history of body pain without obtaining a medical dx or explanation of pain. Discussed his significant improvement and sense of wellbeing.  He is very happy with his medications.  Continue Cymbalta 20 mg twice daily Continue Amitriptyline 50 mg daily Continue  Gabapentin to 300 mg tablet 3 times daily  To report adverse symptoms or worsening symptoms Will follow up in 4 weeks to reassess. Provided emergency contact information. Reviewed PDMP       Joan Flores, NP

## 2021-06-06 DIAGNOSIS — J3089 Other allergic rhinitis: Secondary | ICD-10-CM | POA: Diagnosis not present

## 2021-06-06 DIAGNOSIS — J301 Allergic rhinitis due to pollen: Secondary | ICD-10-CM | POA: Diagnosis not present

## 2021-06-06 DIAGNOSIS — J3081 Allergic rhinitis due to animal (cat) (dog) hair and dander: Secondary | ICD-10-CM | POA: Diagnosis not present

## 2021-06-14 DIAGNOSIS — J3089 Other allergic rhinitis: Secondary | ICD-10-CM | POA: Diagnosis not present

## 2021-06-14 DIAGNOSIS — J3081 Allergic rhinitis due to animal (cat) (dog) hair and dander: Secondary | ICD-10-CM | POA: Diagnosis not present

## 2021-06-14 DIAGNOSIS — J301 Allergic rhinitis due to pollen: Secondary | ICD-10-CM | POA: Diagnosis not present

## 2021-06-18 DIAGNOSIS — Z23 Encounter for immunization: Secondary | ICD-10-CM | POA: Diagnosis not present

## 2021-06-28 DIAGNOSIS — J301 Allergic rhinitis due to pollen: Secondary | ICD-10-CM | POA: Diagnosis not present

## 2021-06-28 DIAGNOSIS — J3089 Other allergic rhinitis: Secondary | ICD-10-CM | POA: Diagnosis not present

## 2021-06-28 DIAGNOSIS — J3081 Allergic rhinitis due to animal (cat) (dog) hair and dander: Secondary | ICD-10-CM | POA: Diagnosis not present

## 2021-07-03 DIAGNOSIS — J301 Allergic rhinitis due to pollen: Secondary | ICD-10-CM | POA: Diagnosis not present

## 2021-07-03 DIAGNOSIS — J3081 Allergic rhinitis due to animal (cat) (dog) hair and dander: Secondary | ICD-10-CM | POA: Diagnosis not present

## 2021-07-03 DIAGNOSIS — J3089 Other allergic rhinitis: Secondary | ICD-10-CM | POA: Diagnosis not present

## 2021-07-12 DIAGNOSIS — J3089 Other allergic rhinitis: Secondary | ICD-10-CM | POA: Diagnosis not present

## 2021-07-12 DIAGNOSIS — J3081 Allergic rhinitis due to animal (cat) (dog) hair and dander: Secondary | ICD-10-CM | POA: Diagnosis not present

## 2021-07-12 DIAGNOSIS — J301 Allergic rhinitis due to pollen: Secondary | ICD-10-CM | POA: Diagnosis not present

## 2021-08-01 DIAGNOSIS — J3089 Other allergic rhinitis: Secondary | ICD-10-CM | POA: Diagnosis not present

## 2021-08-01 DIAGNOSIS — J3081 Allergic rhinitis due to animal (cat) (dog) hair and dander: Secondary | ICD-10-CM | POA: Diagnosis not present

## 2021-08-01 DIAGNOSIS — J301 Allergic rhinitis due to pollen: Secondary | ICD-10-CM | POA: Diagnosis not present

## 2021-08-07 DIAGNOSIS — J3081 Allergic rhinitis due to animal (cat) (dog) hair and dander: Secondary | ICD-10-CM | POA: Diagnosis not present

## 2021-08-07 DIAGNOSIS — J3089 Other allergic rhinitis: Secondary | ICD-10-CM | POA: Diagnosis not present

## 2021-08-07 DIAGNOSIS — J301 Allergic rhinitis due to pollen: Secondary | ICD-10-CM | POA: Diagnosis not present

## 2021-08-15 ENCOUNTER — Ambulatory Visit: Payer: BC Managed Care – PPO | Admitting: Behavioral Health

## 2021-08-15 ENCOUNTER — Other Ambulatory Visit: Payer: Self-pay

## 2021-08-15 ENCOUNTER — Encounter: Payer: Self-pay | Admitting: Behavioral Health

## 2021-08-15 DIAGNOSIS — F330 Major depressive disorder, recurrent, mild: Secondary | ICD-10-CM

## 2021-08-15 DIAGNOSIS — F411 Generalized anxiety disorder: Secondary | ICD-10-CM | POA: Diagnosis not present

## 2021-08-15 DIAGNOSIS — F4542 Pain disorder with related psychological factors: Secondary | ICD-10-CM | POA: Diagnosis not present

## 2021-08-15 MED ORDER — AMITRIPTYLINE HCL 50 MG PO TABS: ORAL_TABLET | ORAL | 3 refills | Status: DC

## 2021-08-15 MED ORDER — DULOXETINE HCL 20 MG PO CPEP: ORAL_CAPSULE | ORAL | 3 refills | Status: DC

## 2021-08-15 NOTE — Progress Notes (Signed)
[facility]  Patient ID: ARIANNA BETHLEY,  MRN: [id number]  PCP: Tammi Sou, MD  Date of Evaluation: 08/15/2021 Time spent:20 minutes  Chief Complaint:  Chief Complaint   Anxiety; Depression; Follow-up; Medication Refill     HISTORY/CURRENT STATUS: HPI  51 year old male presents to this office for follow up and medication management.  He says, "I am feeling great, the medication has been doing wonderful and my pain has subsided. He says that he is very please and doesn't not need any medication adjustments at this time but will need refills. He says he is wanting to reduce his gabapentin due to fatigue and feeling sleepy to often.  Says his wife has noticed the difference in how he feels. He is very surprised that nothing else has helped until now. He says his anxiety is 2/10 and depression is 0/10.   He is sleeping 7-8 hours per night. 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 No mania, no psychosis, no SI/HI. Agrees to follow up in 3 months.   No prior medication trials:  Individual Medical History/ Review of Systems: Changes? :No   Allergies: Zithromax [azithromycin dihydrate]  Current Medications:  Current Outpatient Medications:    albuterol (PROVENTIL HFA) 108 (90 Base) MCG/ACT inhaler, Inhale 2 puffs into the lungs every 6 (six) hours as needed for wheezing or shortness of breath. (Patient not taking: Reported on 04/19/2021), Disp: 1 Inhaler, Rfl: 1   alfuzosin (UROXATRAL) 10 MG 24 hr tablet, Take 10 mg by mouth daily. Take 1 daily., Disp: , Rfl:    amitriptyline (ELAVIL) 50 MG tablet, Take one tablet daily., Disp: 30 tablet, Rfl: 3   benzonatate (TESSALON) 200 MG capsule, Take 1 capsule (200 mg total) by mouth 2 (two) times daily as needed for cough. (Patient not taking: No sig reported), Disp: 20 capsule, Rfl: 0   cetirizine (ZYRTEC) 10 MG tablet, Take 10 mg by mouth daily., Disp: , Rfl:    doxycycline (VIBRA-TABS) 100 MG tablet, Take 1  tablet (100 mg total) by mouth 2 (two) times daily. (Patient not taking: No sig reported), Disp: 20 tablet, Rfl: 0   DULoxetine (CYMBALTA) 20 MG capsule, Take one capsule after breakfast and one after dinner for total of 40 mg daily., Disp: 60 capsule, Rfl: 3   EPINEPHrine 0.3 mg/0.3 mL IJ SOAJ injection, SMARTSIG:Injection IM As Directed, Disp: , Rfl:    esomeprazole (NEXIUM) 40 MG capsule, Take 1 capsule (40 mg total) by mouth daily before breakfast., Disp: 90 capsule, Rfl: 3   fluticasone (FLONASE) 50 MCG/ACT nasal spray, Place 1 spray into both nostrils daily. Must see new provider for future refills, Disp: 16 g, Rfl: 0   gabapentin (NEURONTIN) 300 MG capsule, TAKE 1 TO 2 CAPSULES BY MOUTH 3 TIMES A DAY, Disp: 180 capsule, Rfl: 3   predniSONE (DELTASONE) 20 MG tablet, Take 2 tablets (40 mg total) by mouth daily with breakfast. (Patient not taking: No sig reported), Disp: 10 tablet, Rfl: 0   SYMBICORT 80-4.5 MCG/ACT inhaler, Inhale into the lungs., Disp: , Rfl:  Medication Side Effects: none  Family Medical/ Social History: Changes? No  MENTAL HEALTH EXAM:  There were no vitals taken for this visit.There is no height or weight on file to calculate BMI.  General Appearance: Casual  Eye Contact:  Good  Speech:  Clear and Coherent  Volume:  Normal  Mood:  NA  Affect:  Appropriate  Thought Process:  Coherent  Orientation:  Full (Time, Place, and Person)  Thought Content: Logical   Suicidal Thoughts:  No  Homicidal Thoughts:  No  Memory:  WNL  Judgement:  Good  Insight:  Good  Psychomotor Activity:  Normal  Concentration:  Concentration: Good  Recall:  Good  Fund of Knowledge: Good  Language: Good  Assets:  Desire for Improvement  ADL's:  Intact  Cognition: WNL  Prognosis:  Good    DIAGNOSES:    ICD-10-CM   1. Mild episode of recurrent major depressive disorder ([place])  F33.0     2. Pain disorders related to psychological factors  F45.42     3. Generalized anxiety disorder   F41.1       Receiving Psychotherapy: No    RECOMMENDATIONS:   Greater than 50% of 20  min  face to face time with patient was spent on counseling and coordination of care. We reviewed  his history of body pain without obtaining a medical dx or explanation of pain. Discussed his significant improvement and sense of wellbeing. We discussed his desire to wean down from high dose of Gabapentin due to fatigue. He is not sure he even needs medication anymore. I educated on safe weaning.   Continue Cymbalta 20 mg twice daily Continue Amitriptyline 50 mg daily Continue  Gabapentin to 300 mg tablet 3 times daily. He will reduce by one 300 mg tablet each month until he has halved his total daily dosage over next 3 months. Next visit pt should be taking 900 mg total daily.   To report adverse symptoms or worsening symptoms Will follow up in 4 weeks to reassess. Provided emergency contact information. Reviewed PDMP         Elwanda Brooklyn, NP

## 2021-08-15 NOTE — Addendum Note (Signed)
Addended by: Avelina Laine A on: 08/15/2021 10:09 AM   Modules accepted: Orders

## 2021-08-16 DIAGNOSIS — J301 Allergic rhinitis due to pollen: Secondary | ICD-10-CM | POA: Diagnosis not present

## 2021-08-16 DIAGNOSIS — J3089 Other allergic rhinitis: Secondary | ICD-10-CM | POA: Diagnosis not present

## 2021-08-16 DIAGNOSIS — J3081 Allergic rhinitis due to animal (cat) (dog) hair and dander: Secondary | ICD-10-CM | POA: Diagnosis not present

## 2021-08-22 DIAGNOSIS — J301 Allergic rhinitis due to pollen: Secondary | ICD-10-CM | POA: Diagnosis not present

## 2021-09-07 DIAGNOSIS — J3081 Allergic rhinitis due to animal (cat) (dog) hair and dander: Secondary | ICD-10-CM | POA: Diagnosis not present

## 2021-09-07 DIAGNOSIS — J3089 Other allergic rhinitis: Secondary | ICD-10-CM | POA: Diagnosis not present

## 2021-09-07 DIAGNOSIS — J301 Allergic rhinitis due to pollen: Secondary | ICD-10-CM | POA: Diagnosis not present

## 2021-09-12 ENCOUNTER — Other Ambulatory Visit: Payer: Self-pay | Admitting: Behavioral Health

## 2021-09-12 DIAGNOSIS — F411 Generalized anxiety disorder: Secondary | ICD-10-CM

## 2021-09-12 DIAGNOSIS — F4542 Pain disorder with related psychological factors: Secondary | ICD-10-CM

## 2021-09-12 DIAGNOSIS — F330 Major depressive disorder, recurrent, mild: Secondary | ICD-10-CM

## 2021-09-13 DIAGNOSIS — J3089 Other allergic rhinitis: Secondary | ICD-10-CM | POA: Diagnosis not present

## 2021-09-13 DIAGNOSIS — J3081 Allergic rhinitis due to animal (cat) (dog) hair and dander: Secondary | ICD-10-CM | POA: Diagnosis not present

## 2021-09-13 DIAGNOSIS — J301 Allergic rhinitis due to pollen: Secondary | ICD-10-CM | POA: Diagnosis not present

## 2021-09-20 DIAGNOSIS — J3081 Allergic rhinitis due to animal (cat) (dog) hair and dander: Secondary | ICD-10-CM | POA: Diagnosis not present

## 2021-09-20 DIAGNOSIS — J301 Allergic rhinitis due to pollen: Secondary | ICD-10-CM | POA: Diagnosis not present

## 2021-09-20 DIAGNOSIS — J3089 Other allergic rhinitis: Secondary | ICD-10-CM | POA: Diagnosis not present

## 2021-10-01 DIAGNOSIS — J3081 Allergic rhinitis due to animal (cat) (dog) hair and dander: Secondary | ICD-10-CM | POA: Diagnosis not present

## 2021-10-01 DIAGNOSIS — J301 Allergic rhinitis due to pollen: Secondary | ICD-10-CM | POA: Diagnosis not present

## 2021-10-01 DIAGNOSIS — J3089 Other allergic rhinitis: Secondary | ICD-10-CM | POA: Diagnosis not present

## 2021-10-24 DIAGNOSIS — J3081 Allergic rhinitis due to animal (cat) (dog) hair and dander: Secondary | ICD-10-CM | POA: Diagnosis not present

## 2021-10-24 DIAGNOSIS — J3089 Other allergic rhinitis: Secondary | ICD-10-CM | POA: Diagnosis not present

## 2021-10-25 DIAGNOSIS — H1045 Other chronic allergic conjunctivitis: Secondary | ICD-10-CM | POA: Diagnosis not present

## 2021-10-25 DIAGNOSIS — J301 Allergic rhinitis due to pollen: Secondary | ICD-10-CM | POA: Diagnosis not present

## 2021-10-25 DIAGNOSIS — J3089 Other allergic rhinitis: Secondary | ICD-10-CM | POA: Diagnosis not present

## 2021-10-25 DIAGNOSIS — J3081 Allergic rhinitis due to animal (cat) (dog) hair and dander: Secondary | ICD-10-CM | POA: Diagnosis not present

## 2021-10-25 DIAGNOSIS — J4530 Mild persistent asthma, uncomplicated: Secondary | ICD-10-CM | POA: Diagnosis not present

## 2021-11-01 DIAGNOSIS — J3081 Allergic rhinitis due to animal (cat) (dog) hair and dander: Secondary | ICD-10-CM | POA: Diagnosis not present

## 2021-11-01 DIAGNOSIS — J301 Allergic rhinitis due to pollen: Secondary | ICD-10-CM | POA: Diagnosis not present

## 2021-11-01 DIAGNOSIS — J3089 Other allergic rhinitis: Secondary | ICD-10-CM | POA: Diagnosis not present

## 2021-11-08 DIAGNOSIS — J301 Allergic rhinitis due to pollen: Secondary | ICD-10-CM | POA: Diagnosis not present

## 2021-11-08 DIAGNOSIS — J3081 Allergic rhinitis due to animal (cat) (dog) hair and dander: Secondary | ICD-10-CM | POA: Diagnosis not present

## 2021-11-08 DIAGNOSIS — J3089 Other allergic rhinitis: Secondary | ICD-10-CM | POA: Diagnosis not present

## 2021-11-13 ENCOUNTER — Encounter: Payer: Self-pay | Admitting: Behavioral Health

## 2021-11-13 ENCOUNTER — Ambulatory Visit: Payer: BC Managed Care – PPO | Admitting: Behavioral Health

## 2021-11-13 DIAGNOSIS — F411 Generalized anxiety disorder: Secondary | ICD-10-CM | POA: Diagnosis not present

## 2021-11-13 DIAGNOSIS — F4542 Pain disorder with related psychological factors: Secondary | ICD-10-CM

## 2021-11-13 DIAGNOSIS — F330 Major depressive disorder, recurrent, mild: Secondary | ICD-10-CM

## 2021-11-13 NOTE — Progress Notes (Signed)
[facility] ? ?Patient ID: Travis Reeves,  ?MRN: [id number] ? ?PCP: Tammi Sou, MD ? ?Date of Evaluation: 11/13/2021 ?Time spent:20 minutes ? ?Chief Complaint:  ?Chief Complaint   ?Anxiety; Depression; Follow-up; Medication Refill ?  ? ? ?HISTORY/CURRENT STATUS: ?HPI ? ?51 year old male presents to this office for follow up and medication management. No changes since last visit. No changes in his social life.  He says, "I am feeling great, the medication has been doing wonderful and my pain has subsided. He says that he is very please and doesn't not need any medication adjustments at this time but will need refills. He would like to continue reducing his gabapentin over next couple of months. He says his anxiety is 2/10 and depression is 0/10.   He is sleeping 7-8 hours per night. 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 No mania, no psychosis, no SI/HI. Agrees to follow up in 3 months. ?  ?No prior medication trials: ? ? ? ? ? ?Individual Medical History/ Review of Systems: Changes? :No  ? ?Allergies: Zithromax [azithromycin dihydrate] ? ?Current Medications:  ?Current Outpatient Medications:  ?  albuterol (PROVENTIL HFA) 108 (90 Base) MCG/ACT inhaler, Inhale 2 puffs into the lungs every 6 (six) hours as needed for wheezing or shortness of breath. (Patient not taking: Reported on 04/19/2021), Disp: 1 Inhaler, Rfl: 1 ?  alfuzosin (UROXATRAL) 10 MG 24 hr tablet, Take 10 mg by mouth daily. Take 1 daily., Disp: , Rfl:  ?  amitriptyline (ELAVIL) 50 MG tablet, Take one tablet daily., Disp: 30 tablet, Rfl: 3 ?  benzonatate (TESSALON) 200 MG capsule, Take 1 capsule (200 mg total) by mouth 2 (two) times daily as needed for cough. (Patient not taking: No sig reported), Disp: 20 capsule, Rfl: 0 ?  cetirizine (ZYRTEC) 10 MG tablet, Take 10 mg by mouth daily., Disp: , Rfl:  ?  doxycycline (VIBRA-TABS) 100 MG tablet, Take 1 tablet (100 mg total) by mouth 2 (two) times daily. (Patient not  taking: No sig reported), Disp: 20 tablet, Rfl: 0 ?  DULoxetine (CYMBALTA) 20 MG capsule, Take one capsule after breakfast and one after dinner for total of 40 mg daily., Disp: 60 capsule, Rfl: 3 ?  EPINEPHrine 0.3 mg/0.3 mL IJ SOAJ injection, SMARTSIG:Injection IM As Directed, Disp: , Rfl:  ?  esomeprazole (NEXIUM) 40 MG capsule, Take 1 capsule (40 mg total) by mouth daily before breakfast., Disp: 90 capsule, Rfl: 3 ?  fluticasone (FLONASE) 50 MCG/ACT nasal spray, Place 1 spray into both nostrils daily. Must see new provider for future refills, Disp: 16 g, Rfl: 0 ?  gabapentin (NEURONTIN) 300 MG capsule, TAKE 1 TO 2 CAPSULES BY MOUTH 3 TIMES A DAY, Disp: 180 capsule, Rfl: 3 ?  predniSONE (DELTASONE) 20 MG tablet, Take 2 tablets (40 mg total) by mouth daily with breakfast. (Patient not taking: No sig reported), Disp: 10 tablet, Rfl: 0 ?  SYMBICORT 80-4.5 MCG/ACT inhaler, Inhale into the lungs., Disp: , Rfl:  ?Medication Side Effects: none ? ?Family Medical/ Social History: Changes? No ? ?MENTAL HEALTH EXAM: ? ?There were no vitals taken for this visit.There is no height or weight on file to calculate BMI.  ?General Appearance: Casual, Neat, and Well Groomed  ?Eye Contact:  Good  ?Speech:  Clear and Coherent  ?Volume:  Normal  ?Mood:  Anxious and Depressed  ?Affect:  Appropriate  ?Thought Process:  Coherent  ?Orientation:  Full (Time, Place, and Person)  ?  Thought Content: Logical   ?Suicidal Thoughts:  No  ?Homicidal Thoughts:  No  ?Memory:  WNL  ?Judgement:  Good  ?Insight:  Good  ?Psychomotor Activity:  Normal  ?Concentration:  Concentration: Good  ?Recall:  Good  ?Fund of Knowledge: Good  ?Language: Good  ?Assets:  Desire for Improvement  ?ADL's:  Intact  ?Cognition: WNL  ?Prognosis:  Good  ? ? ?DIAGNOSES:  ?  ICD-10-CM   ?1. Mild episode of recurrent major depressive disorder ([place])  F33.0   ?  ?2. Pain disorders related to psychological factors  F45.42   ?  ?3. Generalized anxiety disorder  F41.1   ?   ? ? ?Receiving Psychotherapy: No  ? ? ?RECOMMENDATIONS:  ?Greater than 50% of 20  min  face to face time with patient was spent on counseling and coordination of care. We reviewed  his history of body pain without obtaining a medical dx or explanation of pain. Discussed his significant improvement and sense of wellbeing. We discussed his desire to wean down from high dose of Gabapentin due to fatigue. He has now reduced his Gabapentin to 900 mg daily.  He is not sure he even needs medication anymore. I educated on safe weaning.   ?Continue Cymbalta 20 mg twice daily ?Continue Amitriptyline 50 mg daily ?Continue  Gabapentin to 300 mg tablet 3 times daily. ? To report adverse symptoms or worsening symptoms ?Will follow up in 3 months to reassess. ?Provided emergency contact information. ?Reviewed PDMP   ? ? ?Elwanda Brooklyn, NP  ?

## 2021-11-14 DIAGNOSIS — D128 Benign neoplasm of rectum: Secondary | ICD-10-CM | POA: Diagnosis not present

## 2021-11-14 DIAGNOSIS — D124 Benign neoplasm of descending colon: Secondary | ICD-10-CM | POA: Diagnosis not present

## 2021-11-14 DIAGNOSIS — D123 Benign neoplasm of transverse colon: Secondary | ICD-10-CM | POA: Diagnosis not present

## 2021-11-14 DIAGNOSIS — D125 Benign neoplasm of sigmoid colon: Secondary | ICD-10-CM | POA: Diagnosis not present

## 2021-11-14 DIAGNOSIS — K649 Unspecified hemorrhoids: Secondary | ICD-10-CM | POA: Diagnosis not present

## 2021-11-14 DIAGNOSIS — Z86010 Personal history of colonic polyps: Secondary | ICD-10-CM | POA: Diagnosis not present

## 2021-11-15 DIAGNOSIS — J3089 Other allergic rhinitis: Secondary | ICD-10-CM | POA: Diagnosis not present

## 2021-11-15 DIAGNOSIS — J301 Allergic rhinitis due to pollen: Secondary | ICD-10-CM | POA: Diagnosis not present

## 2021-11-15 DIAGNOSIS — J3081 Allergic rhinitis due to animal (cat) (dog) hair and dander: Secondary | ICD-10-CM | POA: Diagnosis not present

## 2021-11-21 DIAGNOSIS — J3081 Allergic rhinitis due to animal (cat) (dog) hair and dander: Secondary | ICD-10-CM | POA: Diagnosis not present

## 2021-11-21 DIAGNOSIS — J301 Allergic rhinitis due to pollen: Secondary | ICD-10-CM | POA: Diagnosis not present

## 2021-11-21 DIAGNOSIS — J3089 Other allergic rhinitis: Secondary | ICD-10-CM | POA: Diagnosis not present

## 2021-11-28 DIAGNOSIS — Z0000 Encounter for general adult medical examination without abnormal findings: Secondary | ICD-10-CM | POA: Diagnosis not present

## 2021-11-28 DIAGNOSIS — Z125 Encounter for screening for malignant neoplasm of prostate: Secondary | ICD-10-CM | POA: Diagnosis not present

## 2021-11-28 DIAGNOSIS — Z13220 Encounter for screening for lipoid disorders: Secondary | ICD-10-CM | POA: Diagnosis not present

## 2021-11-29 DIAGNOSIS — J301 Allergic rhinitis due to pollen: Secondary | ICD-10-CM | POA: Diagnosis not present

## 2021-11-29 DIAGNOSIS — J3089 Other allergic rhinitis: Secondary | ICD-10-CM | POA: Diagnosis not present

## 2021-11-29 DIAGNOSIS — J3081 Allergic rhinitis due to animal (cat) (dog) hair and dander: Secondary | ICD-10-CM | POA: Diagnosis not present

## 2021-12-20 ENCOUNTER — Ambulatory Visit (HOSPITAL_BASED_OUTPATIENT_CLINIC_OR_DEPARTMENT_OTHER)
Admission: RE | Admit: 2021-12-20 | Discharge: 2021-12-20 | Disposition: A | Discharge status: Home / Self Care | Payer: BC Managed Care – PPO | Source: Ambulatory Visit | Attending: Family Medicine | Admitting: Family Medicine

## 2021-12-20 ENCOUNTER — Ambulatory Visit: Payer: Self-pay

## 2021-12-20 ENCOUNTER — Encounter: Payer: Self-pay | Admitting: Family Medicine

## 2021-12-20 ENCOUNTER — Ambulatory Visit: Payer: BC Managed Care – PPO | Admitting: Family Medicine

## 2021-12-20 VITALS — BP 108/78 | Ht 69.0 in | Wt 150.0 lb

## 2021-12-20 DIAGNOSIS — M84362A Stress fracture, left tibia, initial encounter for fracture: Secondary | ICD-10-CM | POA: Diagnosis not present

## 2021-12-20 DIAGNOSIS — M898X6 Other specified disorders of bone, lower leg: Secondary | ICD-10-CM

## 2021-12-20 NOTE — Patient Instructions (Signed)
Good to see you ?Please try the boot  ?Please take vitamin D3, vitamin K2 and calcium ?I will call with the x-ray results. ?Please send me a message in MyChart with any questions or updates.  ?Please see me back in 2-3 weeks.  ? ?--Dr. Jordan Likes ? ?

## 2021-12-20 NOTE — Progress Notes (Signed)
?  Travis Reeves - 51 y.o. male MRN [id number]  Date of birth: April 14, 1971 ? ?SUBJECTIVE:  Including CC & ROS.  ?No chief complaint on file. ? ? ?Travis Reeves is a 51 y.o. male that is presenting with left distal tibial pain.  Symptoms have been ongoing for the past few days.  Having significant pain with walking as well as at night.  No history of stress fracture.  He does run about 40 miles per week. ? ? ?Review of Systems ?See HPI  ? ?HISTORY: Past Medical, Surgical, Social, and Family History Reviewed & Updated per EMR.   ?Pertinent Historical Findings include: ? ?Past Medical History:  ?Diagnosis Date  ? Allergic rhinitis   ? Pollen,cat and dog dander, chron allerg conjunctivitis (Dr. Zenaida Niece Winkle)--gets allergy immunotherapy injections.  ? Anxiety   ? PMH of  ? BPH with obstruction/lower urinary tract symptoms 12/2017  ? doing well on alfuzosin as of 08/2020 urol f/u. (also hx of epididymitis and L hydrocele 2014)  ? Chronic chest wall pain 2020-2021  ? Contusion of left ankle 04/2018  ? Xray nl.  Reassured by Ortho (Dr. Madelon Lips)  ? Dysesthesia of multiple sites   ? various areas: MRI brain normal 2021 (Dr. Frances Furbish). [facility] neurology dx'd hime with small fiber neuropathy on skin bx 08/2020->gabapentin  ? Eosinophilic esophagitis 01/13/2019  ? Esoph dilation, started PPI 01/2019.  ? Mild intermittent asthma   ? Thoracic back pain 2020-2021  ? unknown etiology  ? ? ?Past Surgical History:  ?Procedure Laterality Date  ? ESOPHAGOGASTRODUODENOSCOPY  01/29/2019  ? Eosinophilic esophagitis->esoph dilation  ? NCS/EMG  12/2019  ? R upper and R lower ext's NORMAL (Dr. Frances Furbish)  ? SKIN BIOPSY  08/31/2020  ? [facility]) R distal leg and thigh, for suspected small fiber neuropathy->sent for epidermal nerve fiber density and reflex sweat gland nerve fiber density testing  ? VASECTOMY  07/2008  ? Dr. Patsi Sears  ? ? ? ?PHYSICAL EXAM:  ?VS: BP 108/78 (BP Location: Left Arm, Patient Position: Sitting)   Ht 5\' 9"  (1.753 m)    Wt 150 lb (68 kg)   BMI 22.15 kg/m?  ?Physical Exam ?Gen: NAD, alert, cooperative with exam, well-appearing ?MSK:  ?Neurovascularly intact   ? ?Limited ultrasound: Left leg: ? ?No ankle effusion. ?No cortical defect of the distal tibia or distal fibula. ?Soft tissue swelling appreciated. ?Significant hyperemia surrounding the distal tibia ? ?Summary: Findings most consistent with a stress reaction of the distal tibia ? ?Ultrasound and interpretation by , MD ? ? ? ?ASSESSMENT & PLAN:  ? ?Stress fracture of left tibia ?Acutely occurring.  Is an avid runner with swelling of the distal tibia.  No findings consistent with stress fracture but likely reaction today. ?-Counseled on home exercise therapy and supportive care. ?-X-ray. ?-Cam walker. ?-Could consider physical therapy ? ? ? ? ?

## 2021-12-20 NOTE — Assessment & Plan Note (Signed)
Acutely occurring.  Is an avid runner with swelling of the distal tibia.  No findings consistent with stress fracture but likely reaction today. ?-Counseled on home exercise therapy and supportive care. ?-X-ray. ?-Cam walker. ?-Could consider physical therapy ?

## 2021-12-21 DIAGNOSIS — J3089 Other allergic rhinitis: Secondary | ICD-10-CM | POA: Diagnosis not present

## 2021-12-21 DIAGNOSIS — J301 Allergic rhinitis due to pollen: Secondary | ICD-10-CM | POA: Diagnosis not present

## 2021-12-21 DIAGNOSIS — J3081 Allergic rhinitis due to animal (cat) (dog) hair and dander: Secondary | ICD-10-CM | POA: Diagnosis not present

## 2021-12-24 ENCOUNTER — Telehealth: Payer: Self-pay | Admitting: Family Medicine

## 2021-12-24 NOTE — Telephone Encounter (Signed)
Left VM for patient. If he calls back please have him speak with a nurse/CMA and inform that his x-rays are normal.  We will continue with CAM Walker.  ? ?If any questions then please take the best time and phone number to call and I will try to call him back.  ? ?Myra Rude, MD ?[facility] ?12/24/2021, 1:12 PM ? ? ?

## 2021-12-25 DIAGNOSIS — J3089 Other allergic rhinitis: Secondary | ICD-10-CM | POA: Diagnosis not present

## 2021-12-25 DIAGNOSIS — J3081 Allergic rhinitis due to animal (cat) (dog) hair and dander: Secondary | ICD-10-CM | POA: Diagnosis not present

## 2021-12-25 DIAGNOSIS — J301 Allergic rhinitis due to pollen: Secondary | ICD-10-CM | POA: Diagnosis not present

## 2022-01-03 ENCOUNTER — Ambulatory Visit: Payer: BC Managed Care – PPO | Admitting: Family Medicine

## 2022-01-03 VITALS — BP 100/60 | Ht 69.0 in | Wt 145.0 lb

## 2022-01-03 DIAGNOSIS — M5414 Radiculopathy, thoracic region: Secondary | ICD-10-CM | POA: Diagnosis not present

## 2022-01-03 DIAGNOSIS — M84362D Stress fracture, left tibia, subsequent encounter for fracture with routine healing: Secondary | ICD-10-CM

## 2022-01-03 NOTE — Patient Instructions (Signed)
Good to see you Please try the aircast  We will get the MRI at the med center in [place] Please send me a message in [place] with any questions or updates.  We will set up a virtual visit once the MRI is resulted..   --Dr. Raeford Razor

## 2022-01-03 NOTE — Assessment & Plan Note (Signed)
Significant improvement with the cam walker.  No pain on palpation today.  X-ray was reassuring. -Counseled on home exercise therapy and supportive care. -Counseled on using Aircast. -Counseled on returning to running

## 2022-01-03 NOTE — Progress Notes (Signed)
  Travis Reeves - 51 y.o. male MRN [id number]  Date of birth: 1971-04-12  SUBJECTIVE:  Including CC & ROS.  No chief complaint on file.   Travis Reeves is a 51 y.o. male that is following up for his left leg pain.  He has got significant improvement with the cam walker.  He is also presenting with acute on chronic midthoracic pain that has radicular pattern to the left anterior chest.  This been ongoing for couple years now.  X-ray and CT scan have been normal to this point.  He feels this on a fairly regular basis.   Review of Systems See HPI   HISTORY: Past Medical, Surgical, Social, and Family History Reviewed & Updated per EMR.   Pertinent Historical Findings include:  Past Medical History:  Diagnosis Date   Allergic rhinitis    Pollen,cat and dog dander, chron allerg conjunctivitis (Dr. Lucianne Lei Winkle)--gets allergy immunotherapy injections.   Anxiety    PMH of   BPH with obstruction/lower urinary tract symptoms 12/2017   doing well on alfuzosin as of 08/2020 urol f/u. (also hx of epididymitis and L hydrocele 2014)   Chronic chest wall pain 2020-2021   Contusion of left ankle 04/2018   Xray nl.  Reassured by Ortho (Dr. French Ana)   Dysesthesia of multiple sites    various areas: MRI brain normal 2021 (Dr. Rexene Alberts). [facility] neurology dx'd hime with small fiber neuropathy on skin bx [id number]   Eosinophilic esophagitis [id number]   Esoph dilation, started PPI 01/2019.   Mild intermittent asthma    Thoracic back pain 2020-2021   unknown etiology    Past Surgical History:  Procedure Laterality Date   ESOPHAGOGASTRODUODENOSCOPY  [id number]   Eosinophilic esophagitis->esoph dilation   NCS/EMG  12/2019   R upper and R lower ext's NORMAL (Dr. Rexene Alberts)   SKIN BIOPSY  08/31/2020   [facility]) R distal leg and thigh, for suspected small fiber neuropathy->sent for epidermal nerve fiber density and reflex sweat gland nerve fiber density testing   VASECTOMY  07/2008   Dr.  Gaynelle Arabian     PHYSICAL EXAM:  VS: BP 100/60   Ht 5\' 9"  (1.753 m)   Wt 145 lb (65.8 kg)   BMI 21.41 kg/m  Physical Exam Gen: NAD, alert, cooperative with exam, well-appearing MSK:  Neurovascularly intact       ASSESSMENT & PLAN:   Thoracic radiculopathy Acute on chronic in nature.  Has been feeling this more lately.  X-ray x-ray and CT scan of the chest have been normal.  Has been under greater than 6 weeks of physician directed home exercise therapy.  He has tried gabapentin and other medications. -Counseled on home exercise therapy and supportive care. -MRI of the thoracic spine to evaluate for nerve impingement for consideration of epidural.  Stress fracture of left tibia Significant improvement with the cam walker.  No pain on palpation today.  X-ray was reassuring. -Counseled on home exercise therapy and supportive care. -Counseled on using Aircast. -Counseled on returning to running

## 2022-01-03 NOTE — Assessment & Plan Note (Signed)
Acute on chronic in nature.  Has been feeling this more lately.  X-ray x-ray and CT scan of the chest have been normal.  Has been under greater than 6 weeks of physician directed home exercise therapy.  He has tried gabapentin and other medications. -Counseled on home exercise therapy and supportive care. -MRI of the thoracic spine to evaluate for nerve impingement for consideration of epidural.

## 2022-01-07 ENCOUNTER — Other Ambulatory Visit: Payer: Self-pay | Admitting: Behavioral Health

## 2022-01-07 DIAGNOSIS — F330 Major depressive disorder, recurrent, mild: Secondary | ICD-10-CM

## 2022-01-07 DIAGNOSIS — F411 Generalized anxiety disorder: Secondary | ICD-10-CM

## 2022-01-07 DIAGNOSIS — F4542 Pain disorder with related psychological factors: Secondary | ICD-10-CM

## 2022-01-09 ENCOUNTER — Encounter: Payer: Self-pay | Admitting: Family Medicine

## 2022-01-11 ENCOUNTER — Telehealth: Payer: Self-pay | Admitting: Behavioral Health

## 2022-01-11 NOTE — Telephone Encounter (Signed)
Pt lvm at 8:46 am stating that he has questions about his medicines. Please call him at [phone]

## 2022-01-11 NOTE — Telephone Encounter (Signed)
Yes, that would be fine for him to do. If this does not work for him over the next few weeks to call back

## 2022-01-11 NOTE — Telephone Encounter (Signed)
Patient notified of recommendation. Told him to let us know if that didn't work and also to call when he needed a new Rx since he was taking more than prescribed last refill and he would run out sooner.

## 2022-01-11 NOTE — Telephone Encounter (Signed)
Patient said you have been weaning his gabapentin due to c/o fatigue - 300 mg TID. He said previously he was taking 300 BID and 600 at night. He said he has recently been experiencing more pain and thinks he should go back up to 2 at night.

## 2022-01-18 DIAGNOSIS — J301 Allergic rhinitis due to pollen: Secondary | ICD-10-CM | POA: Diagnosis not present

## 2022-01-18 DIAGNOSIS — J3081 Allergic rhinitis due to animal (cat) (dog) hair and dander: Secondary | ICD-10-CM | POA: Diagnosis not present

## 2022-01-18 DIAGNOSIS — J3089 Other allergic rhinitis: Secondary | ICD-10-CM | POA: Diagnosis not present

## 2022-01-27 ENCOUNTER — Other Ambulatory Visit: Payer: BC Managed Care – PPO

## 2022-01-28 ENCOUNTER — Ambulatory Visit (INDEPENDENT_AMBULATORY_CARE_PROVIDER_SITE_OTHER): Payer: BC Managed Care – PPO

## 2022-01-28 DIAGNOSIS — R071 Chest pain on breathing: Secondary | ICD-10-CM | POA: Diagnosis not present

## 2022-01-28 DIAGNOSIS — M549 Dorsalgia, unspecified: Secondary | ICD-10-CM

## 2022-01-28 DIAGNOSIS — M5414 Radiculopathy, thoracic region: Secondary | ICD-10-CM

## 2022-01-28 DIAGNOSIS — M546 Pain in thoracic spine: Secondary | ICD-10-CM | POA: Diagnosis not present

## 2022-01-28 DIAGNOSIS — Z8669 Personal history of other diseases of the nervous system and sense organs: Secondary | ICD-10-CM | POA: Diagnosis not present

## 2022-01-29 ENCOUNTER — Encounter: Payer: Self-pay | Admitting: Family Medicine

## 2022-01-29 ENCOUNTER — Telehealth (INDEPENDENT_AMBULATORY_CARE_PROVIDER_SITE_OTHER): Payer: BC Managed Care – PPO | Admitting: Family Medicine

## 2022-01-29 VITALS — Ht 69.0 in | Wt 145.0 lb

## 2022-01-29 DIAGNOSIS — M5412 Radiculopathy, cervical region: Secondary | ICD-10-CM | POA: Diagnosis not present

## 2022-01-29 NOTE — Progress Notes (Signed)
Virtual Visit via Video Note  I connected with Travis Reeves on 01/29/22 at 10:50 AM EDT by a video enabled telemedicine application and verified that I am speaking with the correct person using two identifiers.  Location: Patient: vehicle Provider: office   I discussed the limitations of evaluation and management by telemedicine and the availability of in person appointments. The patient expressed understanding and agreed to proceed.  History of Present Illness:  Travis Reeves is a 51 year old male that is following up after the MRI of his thoracic spine.  This was not demonstrating any significant nerve impingement.  He did show a widening of the central canal from T8-T12.  He continues to have pain between the shoulder blades with deep breathing.   Observations/Objective:   Assessment and Plan:  Cervical radiculopathy: Continues to have pain in the mid scapular region that is acute on chronic.  Has been present for 2 years.  Has been through physical therapy as well as greater than 6 weeks of physician directed home exercise therapy.  MRI of the thoracic spine was not indicating a specific source.  Previous CT scan of the chest was normal.  The symptoms may be originating from the cervical spine. -Counseled on home exercise therapy and supportive care. -X-ray of the cervical spine.  Follow Up Instructions:    I discussed the assessment and treatment plan with the patient. The patient was provided an opportunity to ask questions and all were answered. The patient agreed with the plan and demonstrated an understanding of the instructions.   The patient was advised to call back or seek an in-person evaluation if the symptoms worsen or if the condition fails to improve as anticipated.    Travis Gandy, MD

## 2022-01-29 NOTE — Assessment & Plan Note (Signed)
Continues to have pain in the mid scapular region that is acute on chronic.  Has been present for 2 years.  Has been through physical therapy as well as greater than 6 weeks of physician directed home exercise therapy.  MRI of the thoracic spine was not indicating a specific source.  Previous CT scan of the chest was normal.  The symptoms may be originating from the cervical spine. -Counseled on home exercise therapy and supportive care. -X-ray of the cervical spine.

## 2022-01-31 DIAGNOSIS — J3089 Other allergic rhinitis: Secondary | ICD-10-CM | POA: Diagnosis not present

## 2022-01-31 DIAGNOSIS — J3081 Allergic rhinitis due to animal (cat) (dog) hair and dander: Secondary | ICD-10-CM | POA: Diagnosis not present

## 2022-01-31 DIAGNOSIS — J301 Allergic rhinitis due to pollen: Secondary | ICD-10-CM | POA: Diagnosis not present

## 2022-02-05 DIAGNOSIS — J3081 Allergic rhinitis due to animal (cat) (dog) hair and dander: Secondary | ICD-10-CM | POA: Diagnosis not present

## 2022-02-05 DIAGNOSIS — J3089 Other allergic rhinitis: Secondary | ICD-10-CM | POA: Diagnosis not present

## 2022-02-05 DIAGNOSIS — J301 Allergic rhinitis due to pollen: Secondary | ICD-10-CM | POA: Diagnosis not present

## 2022-02-06 IMAGING — CT CT CHEST W/ CM
2 of 3 series · 16 of 36 positions shown, 20 images · IV contrast (Omnipaque)
Comparison: None.

CLINICAL DATA: Left-sided chest pain for 6 months.

EXAM:
CT CHEST WITH CONTRAST
TECHNIQUE: Multidetector CT imaging of the chest was performed during
intravenous contrast administration.
CONTRAST:  75mL OMNIPAQUE IOHEXOL 300 MG/ML  SOLN

[Series 2: axial st · axial · 0.74mm/px · z∈[+762,+1070]mm · 13 of 174 slices shown, 17 images]
[im 13/174  mediastinal]
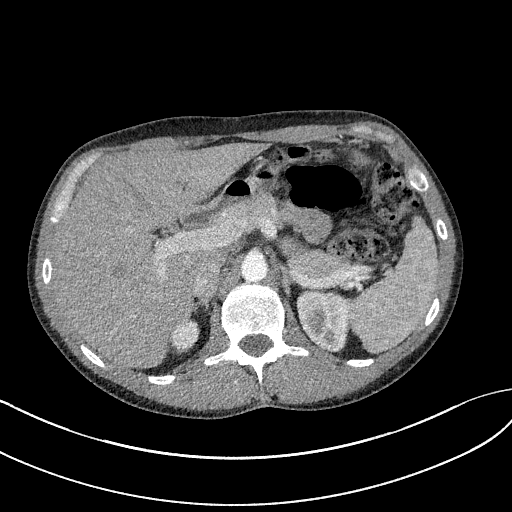
[im 13/174  lung]
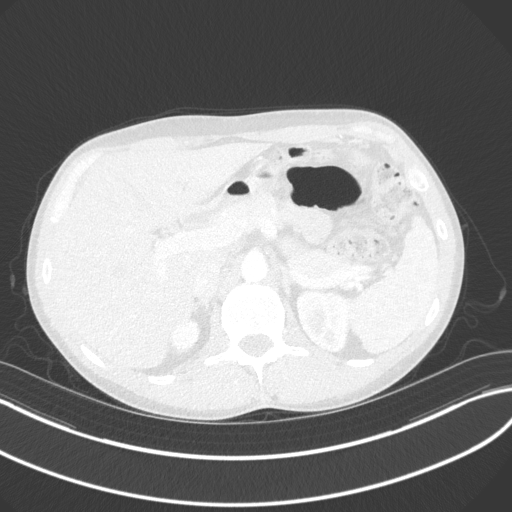
[im 26/174  lung]
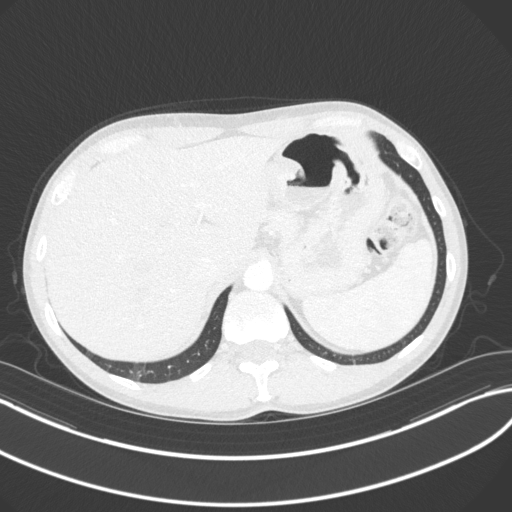
[im 39/174  lung]
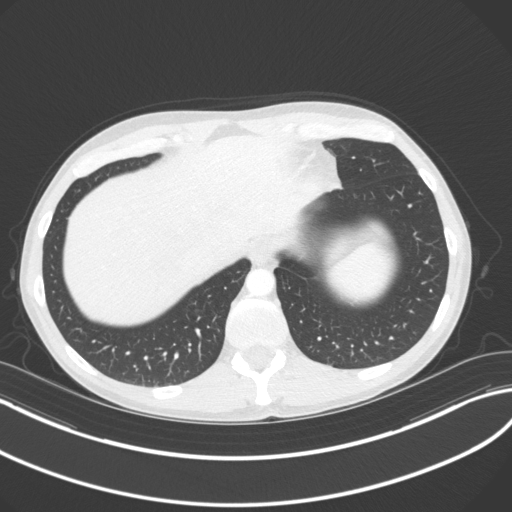
[im 52/174  lung]
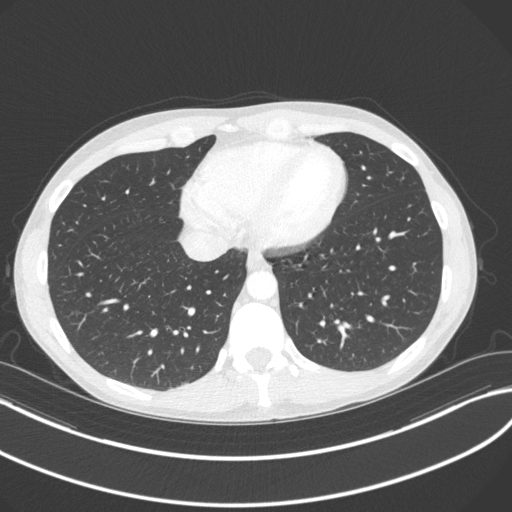
[im 65/174  mediastinal]
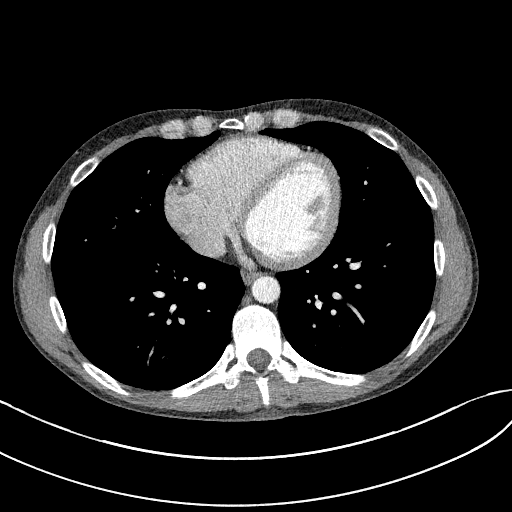
[im 65/174  lung]
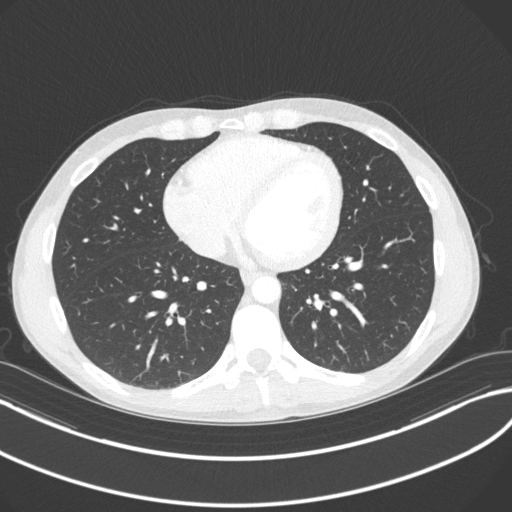
[im 77/174  lung]
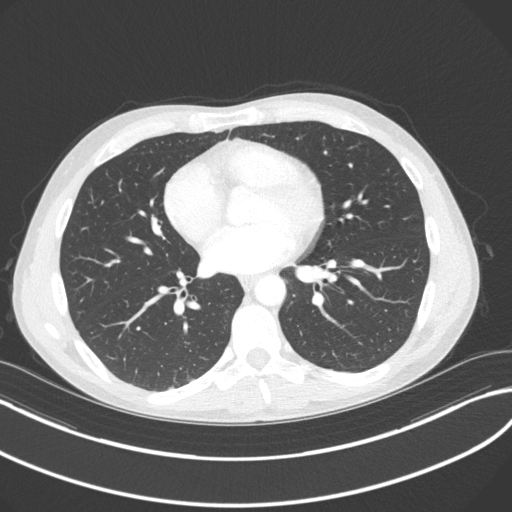
[im 90/174  lung]
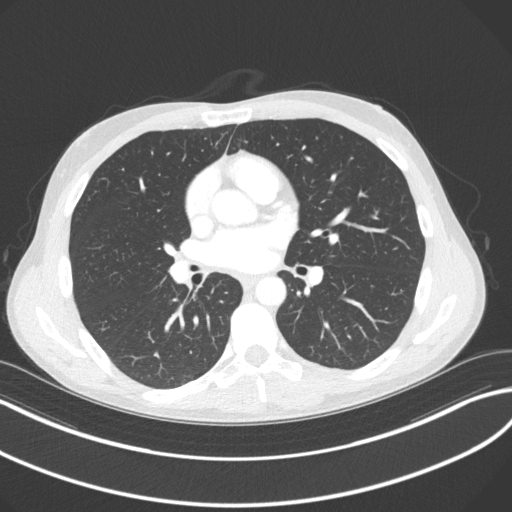
[im 103/174  lung]
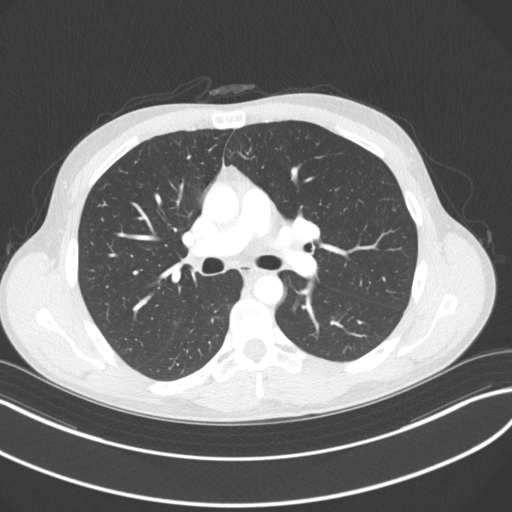
[im 116/174  mediastinal]
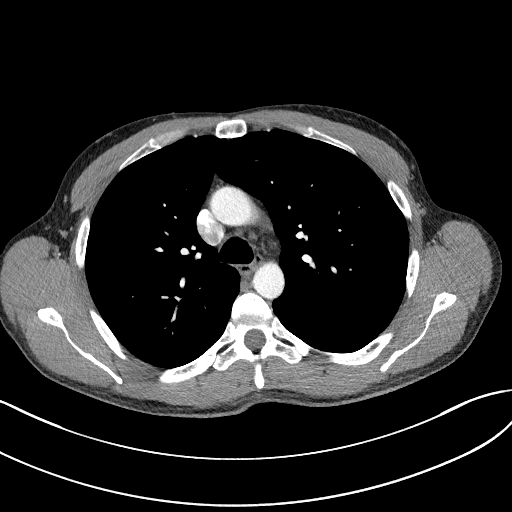
[im 116/174  lung]
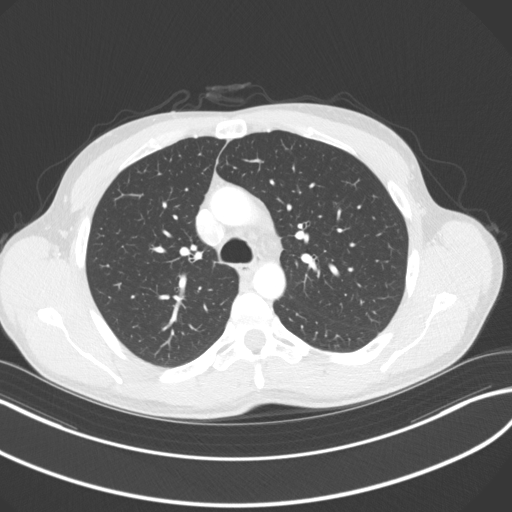
[im 129/174  lung]
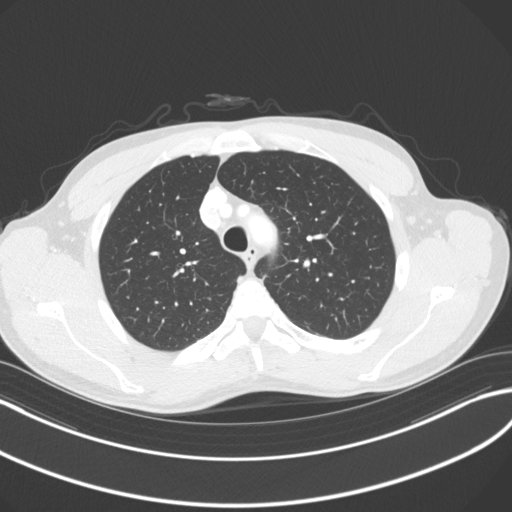
[im 141/174  lung]
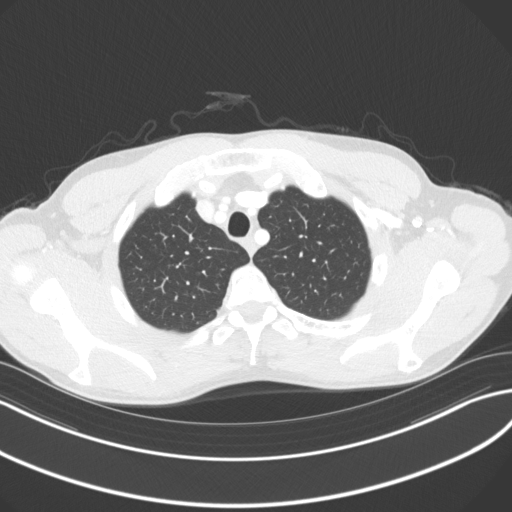
[im 154/174  lung]
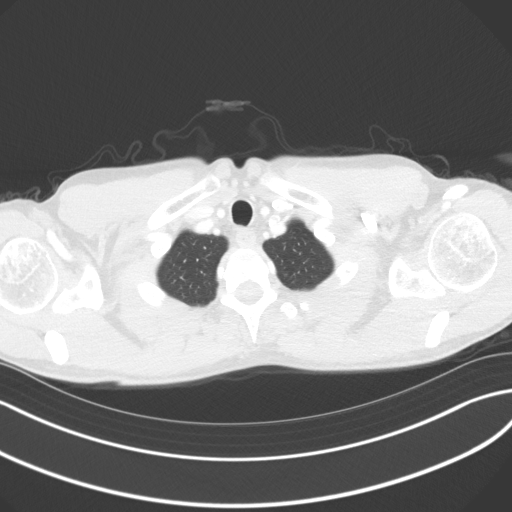
[im 167/174  mediastinal]
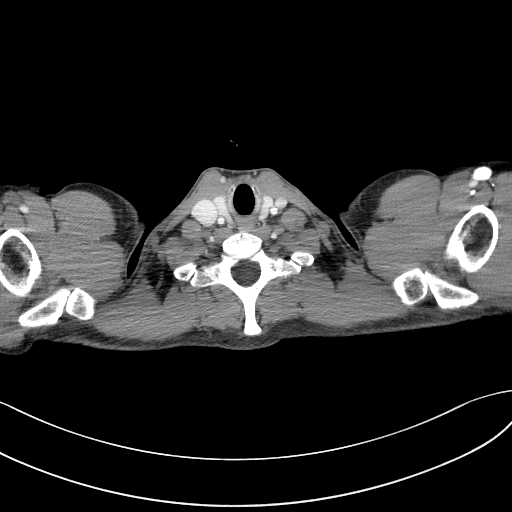
[im 167/174  lung]
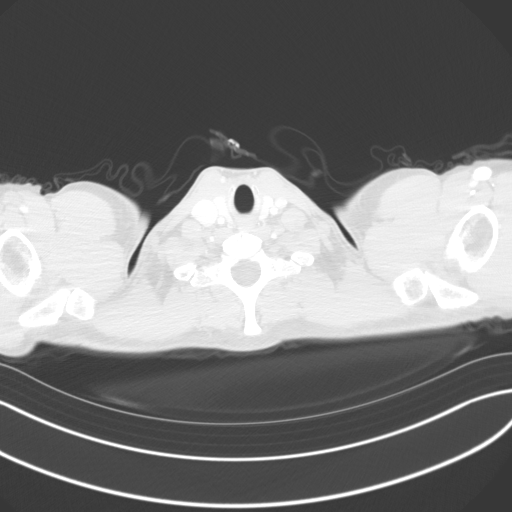

[Series 5: coronal · coronal · 0.71mm/px · 3 of 151 slices shown]
[im 31/151  lung]
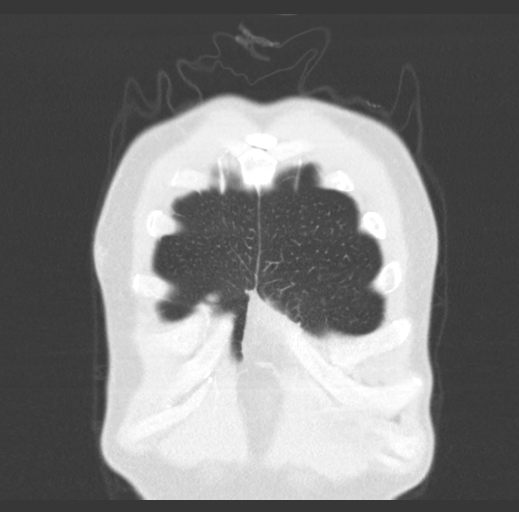
[im 61/151  lung]
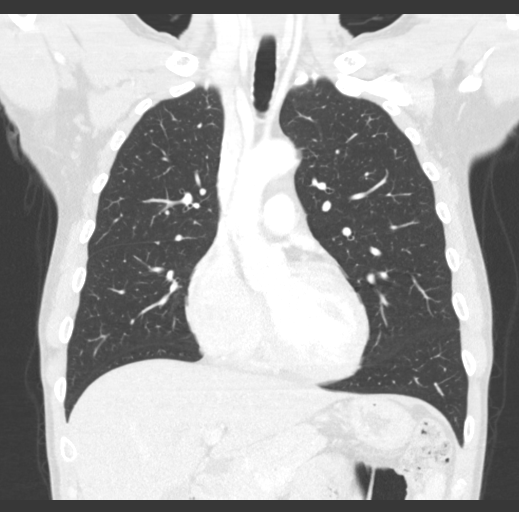
[im 91/151  lung]
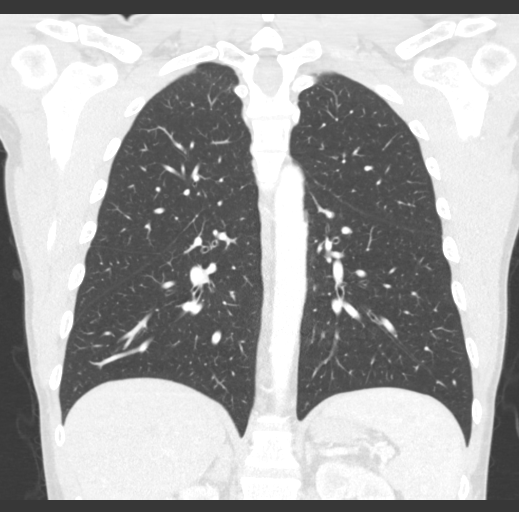

[16 of 36 positions shown; findings below may reference images not displayed]

FINDINGS: Cardiovascular: No significant vascular findings. Normal heart size.
No pericardial effusion.

Mediastinum/Nodes: No enlarged mediastinal, hilar, or axillary lymph
nodes. Thyroid gland, trachea, and esophagus demonstrate no
significant findings.

Lungs/Pleura: Lungs are clear. No pleural effusion or pneumothorax.

Upper Abdomen: No acute abnormality.

Musculoskeletal: No chest wall abnormality. No acute or significant
osseous findings.
IMPRESSION: No abnormality seen in the chest.

## 2022-02-08 ENCOUNTER — Other Ambulatory Visit: Payer: Self-pay | Admitting: Behavioral Health

## 2022-02-08 DIAGNOSIS — F330 Major depressive disorder, recurrent, mild: Secondary | ICD-10-CM

## 2022-02-08 DIAGNOSIS — F411 Generalized anxiety disorder: Secondary | ICD-10-CM

## 2022-02-08 DIAGNOSIS — F4542 Pain disorder with related psychological factors: Secondary | ICD-10-CM

## 2022-02-18 ENCOUNTER — Ambulatory Visit: Payer: BC Managed Care – PPO | Admitting: Behavioral Health

## 2022-02-18 ENCOUNTER — Encounter: Payer: Self-pay | Admitting: Behavioral Health

## 2022-02-18 DIAGNOSIS — F411 Generalized anxiety disorder: Secondary | ICD-10-CM | POA: Diagnosis not present

## 2022-02-18 DIAGNOSIS — F4542 Pain disorder with related psychological factors: Secondary | ICD-10-CM | POA: Diagnosis not present

## 2022-02-18 DIAGNOSIS — F330 Major depressive disorder, recurrent, mild: Secondary | ICD-10-CM | POA: Diagnosis not present

## 2022-02-18 MED ORDER — AMITRIPTYLINE HCL 100 MG PO TABS
100.0000 mg | ORAL_TABLET | Freq: Every day | ORAL | 3 refills | Status: DC
Start: 2022-02-18 — End: 2022-07-29

## 2022-02-18 MED ORDER — DULOXETINE HCL 20 MG PO CPEP: ORAL_CAPSULE | ORAL | 3 refills | Status: DC

## 2022-02-18 NOTE — Progress Notes (Signed)
[facility]  Patient ID: Travis Reeves,  MRN: [id number]  PCP: Jeoffrey Massed, MD  Date of Evaluation: 02/18/2022 Time spent:30 minutes  Chief Complaint:  Chief Complaint   Anxiety; Depression; Follow-up; Medication Refill; Medication Problem     HISTORY/CURRENT STATUS: HPI    51 year old male presents to this office for follow up and medication management. Says he did increase his Gabpentin back of to 1200 mg daily. He worries it is making him fatigued again. No physical explanation for the pain.  No changes in his social life.  Overall, he says that he is very please but would like to consider an increase in his Amitriptyline.  He says his anxiety is 2/10 and depression is 0/10.   He is sleeping 7-8 hours per night. 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 No mania, no psychosis, no SI/HI. Agrees to follow up in 3 months.   No prior medication trials:        Individual Medical History/ Review of Systems: Changes? :No   Allergies: Zithromax [azithromycin dihydrate]  Current Medications:  Current Outpatient Medications:    alfuzosin (UROXATRAL) 10 MG 24 hr tablet, Take 10 mg by mouth daily. Take 1 daily., Disp: , Rfl:    amitriptyline (ELAVIL) 100 MG tablet, Take 1 tablet (100 mg total) by mouth at bedtime., Disp: 30 tablet, Rfl: 3   amitriptyline (ELAVIL) 50 MG tablet, TAKE 1 TABLET BY MOUTH EVERY DAY, Disp: 30 tablet, Rfl: 0   cetirizine (ZYRTEC) 10 MG tablet, Take 10 mg by mouth daily., Disp: , Rfl:    DULoxetine (CYMBALTA) 20 MG capsule, Take one capsule after breakfast and one after dinner for total of 40 mg daily., Disp: 60 capsule, Rfl: 3   EPINEPHrine 0.3 mg/0.3 mL IJ SOAJ injection, SMARTSIG:Injection IM As Directed, Disp: , Rfl:    esomeprazole (NEXIUM) 40 MG capsule, Take 1 capsule (40 mg total) by mouth daily before breakfast., Disp: 90 capsule, Rfl: 3   fluticasone (FLONASE) 50 MCG/ACT nasal spray, Place 1 spray into both  nostrils daily. Must see new provider for future refills, Disp: 16 g, Rfl: 0   gabapentin (NEURONTIN) 300 MG capsule, TAKE 1 TO 2 CAPSULES BY MOUTH 3 TIMES A DAY, Disp: 180 capsule, Rfl: 3   SYMBICORT 80-4.5 MCG/ACT inhaler, Inhale into the lungs., Disp: , Rfl:  Medication Side Effects: none  Family Medical/ Social History: Changes? No  MENTAL HEALTH EXAM:  There were no vitals taken for this visit.There is no height or weight on file to calculate BMI.  General Appearance: Casual, Neat, and Well Groomed  Eye Contact:  Good  Speech:  Clear and Coherent  Volume:  Normal  Mood:  NA  Affect:  Appropriate  Thought Process:  Coherent  Orientation:  Full (Time, Place, and Person)  Thought Content: Logical   Suicidal Thoughts:  No  Homicidal Thoughts:  No  Memory:  WNL  Judgement:  Good  Insight:  Good  Psychomotor Activity:  Normal  Concentration:  Concentration: Good  Recall:  Good  Fund of Knowledge: Good  Language: Good  Assets:  Desire for Improvement  ADL's:  Intact  Cognition: WNL  Prognosis:  Good    DIAGNOSES:    ICD-10-CM   1. Mild episode of recurrent major depressive disorder (HCC)  F33.0 amitriptyline (ELAVIL) 100 MG tablet    DULoxetine (CYMBALTA) 20 MG capsule    2. Pain disorders related to psychological factors  F45.42 amitriptyline (ELAVIL)  100 MG tablet    DULoxetine (CYMBALTA) 20 MG capsule    3. Generalized anxiety disorder  F41.1 amitriptyline (ELAVIL) 100 MG tablet    DULoxetine (CYMBALTA) 20 MG capsule      Receiving Psychotherapy: No    RECOMMENDATIONS:   Greater than 50% of 30  min  face to face time with patient was spent on counseling and coordination of care. We reviewed  his reoccurrence of body pain without obtaining a medical dx or explanation of pain. He feels like his anxiety and depression is still stable.  He increased his Gabapentin again since last visit. He again, would like to rely less on the Gabapentin.  I feel like there is a  psychosocial component to his pain. Fibromyalgia can not be ruled out.  Continue Cymbalta 20 mg twice daily To increase Amitriptyline to 100 mg daily Continue  Gabapentin to 300 mg tablet 3 times daily.  To report adverse symptoms or worsening symptoms Will follow up in 3 months to reassess. Provided emergency contact information. Reviewed PDMP       Joan Flores, NP

## 2022-03-13 ENCOUNTER — Other Ambulatory Visit: Payer: Self-pay | Admitting: Behavioral Health

## 2022-03-13 DIAGNOSIS — F4542 Pain disorder with related psychological factors: Secondary | ICD-10-CM

## 2022-03-13 DIAGNOSIS — F411 Generalized anxiety disorder: Secondary | ICD-10-CM

## 2022-03-13 DIAGNOSIS — J3089 Other allergic rhinitis: Secondary | ICD-10-CM | POA: Diagnosis not present

## 2022-03-13 DIAGNOSIS — F330 Major depressive disorder, recurrent, mild: Secondary | ICD-10-CM

## 2022-03-13 DIAGNOSIS — J301 Allergic rhinitis due to pollen: Secondary | ICD-10-CM | POA: Diagnosis not present

## 2022-03-13 DIAGNOSIS — J3081 Allergic rhinitis due to animal (cat) (dog) hair and dander: Secondary | ICD-10-CM | POA: Diagnosis not present

## 2022-03-25 ENCOUNTER — Telehealth: Payer: Self-pay | Admitting: Behavioral Health

## 2022-03-25 DIAGNOSIS — J3081 Allergic rhinitis due to animal (cat) (dog) hair and dander: Secondary | ICD-10-CM | POA: Diagnosis not present

## 2022-03-25 DIAGNOSIS — J301 Allergic rhinitis due to pollen: Secondary | ICD-10-CM | POA: Diagnosis not present

## 2022-03-25 DIAGNOSIS — J3089 Other allergic rhinitis: Secondary | ICD-10-CM | POA: Diagnosis not present

## 2022-03-25 NOTE — Telephone Encounter (Signed)
Pt called and LM at 4:21pm. He believes he is having a side effect of Amitryptylene. Please call to discuss.

## 2022-03-25 NOTE — Telephone Encounter (Signed)
LVM to RC 

## 2022-03-26 NOTE — Telephone Encounter (Signed)
Left another VM to RC.  

## 2022-03-26 NOTE — Telephone Encounter (Signed)
Patient said you recently increased his amitriptyline. He said he is having some confusion. He said he knows what he wants to say but can't find the right words to say. He reports no other adverse effects.

## 2022-03-27 NOTE — Telephone Encounter (Signed)
Have him reduce to 1.5 tablets 75 mg total and see if this improves. If no improvement in the next couple of weeks call back.

## 2022-03-27 NOTE — Telephone Encounter (Signed)
Patient notified

## 2022-03-28 ENCOUNTER — Other Ambulatory Visit: Payer: Self-pay | Admitting: Internal Medicine

## 2022-04-05 DIAGNOSIS — J301 Allergic rhinitis due to pollen: Secondary | ICD-10-CM | POA: Diagnosis not present

## 2022-04-05 DIAGNOSIS — J3089 Other allergic rhinitis: Secondary | ICD-10-CM | POA: Diagnosis not present

## 2022-04-05 DIAGNOSIS — J3081 Allergic rhinitis due to animal (cat) (dog) hair and dander: Secondary | ICD-10-CM | POA: Diagnosis not present

## 2022-04-18 DIAGNOSIS — J3089 Other allergic rhinitis: Secondary | ICD-10-CM | POA: Diagnosis not present

## 2022-04-18 DIAGNOSIS — J3081 Allergic rhinitis due to animal (cat) (dog) hair and dander: Secondary | ICD-10-CM | POA: Diagnosis not present

## 2022-04-18 DIAGNOSIS — J301 Allergic rhinitis due to pollen: Secondary | ICD-10-CM | POA: Diagnosis not present

## 2022-04-26 DIAGNOSIS — J3081 Allergic rhinitis due to animal (cat) (dog) hair and dander: Secondary | ICD-10-CM | POA: Diagnosis not present

## 2022-04-26 DIAGNOSIS — J301 Allergic rhinitis due to pollen: Secondary | ICD-10-CM | POA: Diagnosis not present

## 2022-04-26 DIAGNOSIS — J3089 Other allergic rhinitis: Secondary | ICD-10-CM | POA: Diagnosis not present

## 2022-05-03 DIAGNOSIS — J3081 Allergic rhinitis due to animal (cat) (dog) hair and dander: Secondary | ICD-10-CM | POA: Diagnosis not present

## 2022-05-03 DIAGNOSIS — J301 Allergic rhinitis due to pollen: Secondary | ICD-10-CM | POA: Diagnosis not present

## 2022-05-03 DIAGNOSIS — J3089 Other allergic rhinitis: Secondary | ICD-10-CM | POA: Diagnosis not present

## 2022-05-08 ENCOUNTER — Other Ambulatory Visit: Payer: Self-pay | Admitting: Behavioral Health

## 2022-05-08 DIAGNOSIS — F411 Generalized anxiety disorder: Secondary | ICD-10-CM

## 2022-05-08 DIAGNOSIS — F4542 Pain disorder with related psychological factors: Secondary | ICD-10-CM

## 2022-05-08 DIAGNOSIS — F330 Major depressive disorder, recurrent, mild: Secondary | ICD-10-CM

## 2022-05-08 NOTE — Telephone Encounter (Signed)
Pt called and would like refill of Gabapentin sent to [place].  He said pharmacy says not until Mon.  He is out of meds tomorrow.  Next appt 10/9

## 2022-05-09 NOTE — Telephone Encounter (Signed)
Called pharmacy. Pharmacist said that patient was out of RF and a RF request was sent 9/27. He said their automated system gives [place] 3 days to fill Rx, which is why patient was probably told couldn't get until Monday. A RF had already been sent earlier today and pharmacist it would be filled today. LVM for patient to [facility].

## 2022-05-09 NOTE — Telephone Encounter (Signed)
Patient returned call and info provided.

## 2022-05-16 DIAGNOSIS — J3081 Allergic rhinitis due to animal (cat) (dog) hair and dander: Secondary | ICD-10-CM | POA: Diagnosis not present

## 2022-05-16 DIAGNOSIS — J3089 Other allergic rhinitis: Secondary | ICD-10-CM | POA: Diagnosis not present

## 2022-05-16 DIAGNOSIS — J301 Allergic rhinitis due to pollen: Secondary | ICD-10-CM | POA: Diagnosis not present

## 2022-05-20 ENCOUNTER — Ambulatory Visit: Payer: BC Managed Care – PPO | Admitting: Behavioral Health

## 2022-05-24 DIAGNOSIS — J3081 Allergic rhinitis due to animal (cat) (dog) hair and dander: Secondary | ICD-10-CM | POA: Diagnosis not present

## 2022-05-24 DIAGNOSIS — Z23 Encounter for immunization: Secondary | ICD-10-CM | POA: Diagnosis not present

## 2022-05-24 DIAGNOSIS — J301 Allergic rhinitis due to pollen: Secondary | ICD-10-CM | POA: Diagnosis not present

## 2022-05-24 DIAGNOSIS — J3089 Other allergic rhinitis: Secondary | ICD-10-CM | POA: Diagnosis not present

## 2022-05-27 ENCOUNTER — Ambulatory Visit: Payer: BC Managed Care – PPO | Admitting: Behavioral Health

## 2022-05-27 ENCOUNTER — Encounter: Payer: Self-pay | Admitting: Behavioral Health

## 2022-05-27 DIAGNOSIS — F411 Generalized anxiety disorder: Secondary | ICD-10-CM

## 2022-05-27 DIAGNOSIS — F331 Major depressive disorder, recurrent, moderate: Secondary | ICD-10-CM

## 2022-05-27 DIAGNOSIS — F4542 Pain disorder with related psychological factors: Secondary | ICD-10-CM

## 2022-05-27 DIAGNOSIS — F330 Major depressive disorder, recurrent, mild: Secondary | ICD-10-CM

## 2022-05-27 MED ORDER — DULOXETINE HCL 20 MG PO CPEP: ORAL_CAPSULE | ORAL | 3 refills | Status: DC

## 2022-05-27 MED ORDER — GABAPENTIN 300 MG PO CAPS: ORAL_CAPSULE | ORAL | 1 refills | Status: DC

## 2022-05-27 MED ORDER — HYDROXYZINE HCL 25 MG PO TABS: 25.0000 mg | ORAL_TABLET | Freq: Three times a day (TID) | ORAL | 0 refills | Status: DC | PRN

## 2022-05-27 MED ORDER — AMITRIPTYLINE HCL 100 MG PO TABS: 100.0000 mg | ORAL_TABLET | Freq: Every day | ORAL | 1 refills | Status: DC

## 2022-05-27 NOTE — Progress Notes (Signed)
[facility]  Patient ID: MARCELLOUS VELTMAN,  MRN: [id number]  PCP: Tammi Sou, MD  Date of Evaluation: 05/27/2022 Time spent:30 minutes  Chief Complaint:   HISTORY/CURRENT STATUS: HPI  51 year old male presents to this office for follow up and medication management. Says he has been doing very well with anxiety and depression but is developing a mid sternal pain or discomfort that he is not sure is panic. Occurs at night before bedtime. Says he drinks milk and it calms it down.   No physical explanation for the pain.  No changes in his social life.  He increased his Amitriptyline to 100 mg.  He says his anxiety is 2/10 and depression is 0/10.   He is sleeping 7-8 hours per night. 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 No mania, no psychosis, no SI/HI. Agrees to follow up in 3 months.   No prior medication trials:    Individual Medical History/ Review of Systems: Changes? :No   Allergies: Zithromax [azithromycin dihydrate]  Current Medications:  Current Outpatient Medications:    amitriptyline (ELAVIL) 100 MG tablet, Take 1 tablet (100 mg total) by mouth at bedtime., Disp: 90 tablet, Rfl: 1   hydrOXYzine (ATARAX) 25 MG tablet, Take 1 tablet (25 mg total) by mouth 3 (three) times daily as needed., Disp: 30 tablet, Rfl: 0   alfuzosin (UROXATRAL) 10 MG 24 hr tablet, Take 10 mg by mouth daily. Take 1 daily., Disp: , Rfl:    amitriptyline (ELAVIL) 100 MG tablet, Take 1 tablet (100 mg total) by mouth at bedtime., Disp: 30 tablet, Rfl: 3   amitriptyline (ELAVIL) 50 MG tablet, TAKE 1 TABLET BY MOUTH EVERY DAY, Disp: 30 tablet, Rfl: 2   cetirizine (ZYRTEC) 10 MG tablet, Take 10 mg by mouth daily., Disp: , Rfl:    DULoxetine (CYMBALTA) 20 MG capsule, Take one capsule after breakfast and one after dinner for total of 40 mg daily., Disp: 60 capsule, Rfl: 3   EPINEPHrine 0.3 mg/0.3 mL IJ SOAJ injection, SMARTSIG:Injection IM As Directed, Disp: , Rfl:     esomeprazole (NEXIUM) 40 MG capsule, Take 1 capsule (40 mg total) by mouth daily before breakfast., Disp: 90 capsule, Rfl: 3   fluticasone (FLONASE) 50 MCG/ACT nasal spray, Place 1 spray into both nostrils daily. Must see new provider for future refills, Disp: 16 g, Rfl: 0   gabapentin (NEURONTIN) 300 MG capsule, Take one capsule three time daily., Disp: 180 capsule, Rfl: 1   SYMBICORT 80-4.5 MCG/ACT inhaler, Inhale into the lungs., Disp: , Rfl:  Medication Side Effects: none  Family Medical/ Social History: Changes? No  MENTAL HEALTH EXAM:  There were no vitals taken for this visit.There is no height or weight on file to calculate BMI.  General Appearance: Casual, Neat, and Well Groomed  Eye Contact:  Good  Speech:  Clear and Coherent  Volume:  Normal  Mood:  Anxious  Affect:  Appropriate  Thought Process:  Coherent  Orientation:  Full (Time, Place, and Person)  Thought Content: Logical   Suicidal Thoughts:  No  Homicidal Thoughts:  No  Memory:  WNL  Judgement:  Good  Insight:  Good  Psychomotor Activity:  Normal  Concentration:  Concentration: Good  Recall:  Good  Fund of Knowledge: Good  Language: Good  Assets:  Desire for Improvement  ADL's:  Intact  Cognition: WNL  Prognosis:  Good    DIAGNOSES:    ICD-10-CM   1. Generalized anxiety  disorder  F41.1 hydrOXYzine (ATARAX) 25 MG tablet    amitriptyline (ELAVIL) 100 MG tablet    DULoxetine (CYMBALTA) 20 MG capsule    gabapentin (NEURONTIN) 300 MG capsule    2. Major depressive disorder, recurrent episode, moderate (HCC)  F33.1 amitriptyline (ELAVIL) 100 MG tablet    3. Pain disorders related to psychological factors  F45.42 hydrOXYzine (ATARAX) 25 MG tablet    amitriptyline (ELAVIL) 100 MG tablet    DULoxetine (CYMBALTA) 20 MG capsule    gabapentin (NEURONTIN) 300 MG capsule    4. Mild episode of recurrent major depressive disorder (HCC)  F33.0 DULoxetine (CYMBALTA) 20 MG capsule    gabapentin (NEURONTIN) 300 MG  capsule      Receiving Psychotherapy: No    RECOMMENDATIONS:    Greater than 50% of 30  min  face to face time with patient was spent on counseling and coordination of care. We reviewed  his reoccurrence of body pain without obtaining a medical dx or explanation of pain. He feels like his anxiety and depression is still stable.  He says that he increased his amitriptyline again to 100 mg at bedtime. We discussed a mid sternal  pain or discomfort he gets at bedtime. Says he has experienced this most of life but has not mentioned it. Says he can drink a glass of milk and it goes away. Advised him to follow up with PCP or gastroenterology.  Continue Cymbalta 20 mg twice daily To continue Amitriptyline to 100 mg daily Continue  Gabapentin to 300 mg tablet 3 times daily. To start hydroxyzine 25 mg three time daily for anxiety. May take 50 mg at bedtime for anxiety.   To report adverse symptoms or worsening symptoms Will follow up in 2 months to reassess. Provided emergency contact information. Reviewed PDMP       Elwanda [place], NP

## 2022-05-31 DIAGNOSIS — J301 Allergic rhinitis due to pollen: Secondary | ICD-10-CM | POA: Diagnosis not present

## 2022-05-31 DIAGNOSIS — J3089 Other allergic rhinitis: Secondary | ICD-10-CM | POA: Diagnosis not present

## 2022-05-31 DIAGNOSIS — J3081 Allergic rhinitis due to animal (cat) (dog) hair and dander: Secondary | ICD-10-CM | POA: Diagnosis not present

## 2022-06-04 ENCOUNTER — Other Ambulatory Visit: Payer: Self-pay | Admitting: Behavioral Health

## 2022-06-04 DIAGNOSIS — F411 Generalized anxiety disorder: Secondary | ICD-10-CM

## 2022-06-04 DIAGNOSIS — F4542 Pain disorder with related psychological factors: Secondary | ICD-10-CM

## 2022-06-04 DIAGNOSIS — F330 Major depressive disorder, recurrent, mild: Secondary | ICD-10-CM

## 2022-06-08 DIAGNOSIS — H10022 Other mucopurulent conjunctivitis, left eye: Secondary | ICD-10-CM | POA: Diagnosis not present

## 2022-06-28 DIAGNOSIS — J3081 Allergic rhinitis due to animal (cat) (dog) hair and dander: Secondary | ICD-10-CM | POA: Diagnosis not present

## 2022-06-28 DIAGNOSIS — J3089 Other allergic rhinitis: Secondary | ICD-10-CM | POA: Diagnosis not present

## 2022-06-28 DIAGNOSIS — J301 Allergic rhinitis due to pollen: Secondary | ICD-10-CM | POA: Diagnosis not present

## 2022-07-02 DIAGNOSIS — J3089 Other allergic rhinitis: Secondary | ICD-10-CM | POA: Diagnosis not present

## 2022-07-02 DIAGNOSIS — J3081 Allergic rhinitis due to animal (cat) (dog) hair and dander: Secondary | ICD-10-CM | POA: Diagnosis not present

## 2022-07-02 DIAGNOSIS — J301 Allergic rhinitis due to pollen: Secondary | ICD-10-CM | POA: Diagnosis not present

## 2022-07-10 DIAGNOSIS — K219 Gastro-esophageal reflux disease without esophagitis: Secondary | ICD-10-CM | POA: Diagnosis not present

## 2022-07-11 ENCOUNTER — Ambulatory Visit: Payer: BC Managed Care – PPO | Admitting: Podiatry

## 2022-07-11 ENCOUNTER — Encounter: Payer: Self-pay | Admitting: Podiatry

## 2022-07-11 DIAGNOSIS — L600 Ingrowing nail: Secondary | ICD-10-CM

## 2022-07-11 NOTE — Patient Instructions (Signed)

## 2022-07-12 NOTE — Progress Notes (Signed)
Subjective:   Patient ID: Travis Reeves, male   DOB: 51 y.o.   MRN: [id number]   HPI Patient states that he has had a painful nailbed left hallux that was extremely painful and now not quite to the same degree but still sore and he has a race coming up in approximate 11 days.  Patient does not smoke likes to be active   Review of Systems  All other systems reviewed and are negative.       Objective:  Physical Exam Vitals and nursing note reviewed.  Constitutional:      Appearance: He is well-developed.  Pulmonary:     Effort: Pulmonary effort is normal.  Musculoskeletal:        General: Normal range of motion.  Skin:    General: Skin is warm.  Neurological:     Mental Status: He is alert.     Neurovascular status intact muscle strength adequate range of motion within normal limits with patient found to have an incurvated left hallux lateral border and when pressed it was very tender with slight redness no active drainage.  Patient is found to have good digital perfusion well-oriented x 3     Assessment:  What appears to be an active ingrown toenail deformity left hallux painful when pressed that is making walking difficult     Plan:  H&P reviewed condition discussed at great length and we reviewed pros and cons of nail border removal.  He has opted for this approach and I did explain it is good to drain and I do think he will be fine for the race but I do want him to soak it daily and he wants the procedure signed consent form understanding risk and today I infiltrated the left hallux 60 mg like Marcaine mixture sterile prep and using sterile instrumentation remove the lateral border left hallux exposed matrix applied phenol 3 applications 30 seconds followed by alcohol lavage sterile dressing gave instructions on soaks and to wear dressing for 24 hours but take it off earlier if needed.  Encouraged to call with questions concerns which may arise

## 2022-07-19 DIAGNOSIS — J3081 Allergic rhinitis due to animal (cat) (dog) hair and dander: Secondary | ICD-10-CM | POA: Diagnosis not present

## 2022-07-19 DIAGNOSIS — J3089 Other allergic rhinitis: Secondary | ICD-10-CM | POA: Diagnosis not present

## 2022-07-19 DIAGNOSIS — J301 Allergic rhinitis due to pollen: Secondary | ICD-10-CM | POA: Diagnosis not present

## 2022-07-25 DIAGNOSIS — J3089 Other allergic rhinitis: Secondary | ICD-10-CM | POA: Diagnosis not present

## 2022-07-25 DIAGNOSIS — J301 Allergic rhinitis due to pollen: Secondary | ICD-10-CM | POA: Diagnosis not present

## 2022-07-25 DIAGNOSIS — J3081 Allergic rhinitis due to animal (cat) (dog) hair and dander: Secondary | ICD-10-CM | POA: Diagnosis not present

## 2022-07-29 ENCOUNTER — Ambulatory Visit: Payer: BC Managed Care – PPO | Admitting: Podiatry

## 2022-07-29 ENCOUNTER — Encounter: Payer: Self-pay | Admitting: Behavioral Health

## 2022-07-29 ENCOUNTER — Ambulatory Visit: Payer: BC Managed Care – PPO | Admitting: Behavioral Health

## 2022-07-29 DIAGNOSIS — F411 Generalized anxiety disorder: Secondary | ICD-10-CM

## 2022-07-29 DIAGNOSIS — L03032 Cellulitis of left toe: Secondary | ICD-10-CM | POA: Diagnosis not present

## 2022-07-29 DIAGNOSIS — F4542 Pain disorder with related psychological factors: Secondary | ICD-10-CM | POA: Diagnosis not present

## 2022-07-29 DIAGNOSIS — F331 Major depressive disorder, recurrent, moderate: Secondary | ICD-10-CM | POA: Diagnosis not present

## 2022-07-29 DIAGNOSIS — F330 Major depressive disorder, recurrent, mild: Secondary | ICD-10-CM

## 2022-07-29 MED ORDER — DULOXETINE HCL 20 MG PO CPEP: ORAL_CAPSULE | ORAL | 3 refills | Status: DC

## 2022-07-29 MED ORDER — GABAPENTIN 300 MG PO CAPS: ORAL_CAPSULE | ORAL | 1 refills | Status: DC

## 2022-07-29 MED ORDER — AMITRIPTYLINE HCL 100 MG PO TABS: 100.0000 mg | ORAL_TABLET | Freq: Every day | ORAL | 1 refills | Status: DC

## 2022-07-29 MED ORDER — HYDROXYZINE HCL 25 MG PO TABS: 25.0000 mg | ORAL_TABLET | Freq: Three times a day (TID) | ORAL | 3 refills | Status: DC | PRN

## 2022-07-29 MED ORDER — DOXYCYCLINE HYCLATE 100 MG PO TABS: 100.0000 mg | ORAL_TABLET | Freq: Two times a day (BID) | ORAL | 1 refills | Status: AC

## 2022-07-29 NOTE — Progress Notes (Signed)
Subjective:   Patient ID: Travis Reeves, male   DOB: 51 y.o.   MRN: [id number]   HPI Patient states has had some drainage and mild discomfort around where we took the ingrown toenail out and states he is been very active and did do a 42 mile run neurovascular   ROS      Objective:  Physical Exam  Status intact with patient found to have slight redness on the lateral aspect left eye with crusted tissue which may also be traumatized by excessive activity     Assessment:  Low-grade paronychia infection of the left hallux lateral border     Plan:  H&P reviewed condition explained to him with education at this point I recommended doxycycline and I may need to go in there and unplug it but at this point we will try the antibiotics continued soaks bandage usage.  Reappoint if symptoms get worse or if any issues were to occur

## 2022-07-29 NOTE — Progress Notes (Signed)
[facility]  Patient ID: Travis Reeves,  MRN: [id number]  PCP: Tally Joe, MD  Date of Evaluation: 07/29/2022 Time spent:30 minutes  Chief Complaint:  Chief Complaint   Anxiety; Depression; Follow-up; Medication Refill; Patient Education     HISTORY/CURRENT STATUS: HPI  51 year old male presents to this office for follow up and medication management. Says he has been doing very well with anxiety and depression. He has utilized hydroxyzine a few times when anxiety is increased. It has helped with his mid sternal possible gastric pain that occurs.   No changes in his social life.  Requesting no medication changes this visit.  He says his anxiety is 2/10 and depression is 0/10.   He is sleeping 7-8 hours per night. 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 No mania, no psychosis, no SI/HI. Agrees to follow up in 4 months.   No prior medication trials:     Individual Medical History/ Review of Systems: Changes? :No   Allergies: Zithromax [azithromycin dihydrate]  Current Medications:  Current Outpatient Medications:    alfuzosin (UROXATRAL) 10 MG 24 hr tablet, Take 10 mg by mouth daily. Take 1 daily., Disp: , Rfl:    amitriptyline (ELAVIL) 100 MG tablet, Take 1 tablet (100 mg total) by mouth at bedtime., Disp: 90 tablet, Rfl: 1   cetirizine (ZYRTEC) 10 MG tablet, Take 10 mg by mouth daily., Disp: , Rfl:    DULoxetine (CYMBALTA) 20 MG capsule, Take one capsule after breakfast and one after dinner for total of 40 mg daily., Disp: 60 capsule, Rfl: 3   EPINEPHrine 0.3 mg/0.3 mL IJ SOAJ injection, SMARTSIG:Injection IM As Directed, Disp: , Rfl:    esomeprazole (NEXIUM) 40 MG capsule, Take 1 capsule (40 mg total) by mouth daily before breakfast., Disp: 90 capsule, Rfl: 3   fluticasone (FLONASE) 50 MCG/ACT nasal spray, Place 1 spray into both nostrils daily. Must see new provider for future refills, Disp: 16 g, Rfl: 0   gabapentin (NEURONTIN) 300 MG  capsule, Take one capsule by mouth twice daily., Disp: 180 capsule, Rfl: 1   hydrOXYzine (ATARAX) 25 MG tablet, Take 1 tablet (25 mg total) by mouth 3 (three) times daily as needed., Disp: 30 tablet, Rfl: 3   SYMBICORT 80-4.5 MCG/ACT inhaler, Inhale into the lungs., Disp: , Rfl:  Medication Side Effects: none  Family Medical/ Social History: Changes? No  MENTAL HEALTH EXAM:  There were no vitals taken for this visit.There is no height or weight on file to calculate BMI.  General Appearance: Casual and Neat  Eye Contact:  Good  Speech:  Clear and Coherent  Volume:  Normal  Mood:  NA  Affect:  Appropriate  Thought Process:  Coherent  Orientation:  Full (Time, Place, and Person)  Thought Content: Logical   Suicidal Thoughts:  No  Homicidal Thoughts:  No  Memory:  WNL  Judgement:  Good  Insight:  Good  Psychomotor Activity:  Normal  Concentration:  Concentration: Good  Recall:  Good  Fund of Knowledge: Good  Language: Good  Assets:  Desire for Improvement  ADL's:  Intact  Cognition: WNL  Prognosis:  Good    DIAGNOSES:    ICD-10-CM   1. Major depressive disorder, recurrent episode, moderate (HCC)  F33.1 amitriptyline (ELAVIL) 100 MG tablet    2. Generalized anxiety disorder  F41.1 amitriptyline (ELAVIL) 100 MG tablet    DULoxetine (CYMBALTA) 20 MG capsule    gabapentin (NEURONTIN) 300 MG capsule  hydrOXYzine (ATARAX) 25 MG tablet    3. Pain disorders related to psychological factors  F45.42 amitriptyline (ELAVIL) 100 MG tablet    DULoxetine (CYMBALTA) 20 MG capsule    gabapentin (NEURONTIN) 300 MG capsule    hydrOXYzine (ATARAX) 25 MG tablet    4. Mild episode of recurrent major depressive disorder (HCC)  F33.0 DULoxetine (CYMBALTA) 20 MG capsule    gabapentin (NEURONTIN) 300 MG capsule      Receiving Psychotherapy: No    RECOMMENDATIONS:    Greater than 50% of 30  min  face to face time with patient was spent on counseling and coordination of care. He feels  like his anxiety and depression is still stable.  He says that he increased his amitriptyline again to 100 mg at bedtime. We discussed a mid sternal  pain or discomfort he gets at bedtime. He has utilized hydroxyzine on a few occasions and it has helped.  Continue Cymbalta 20 mg twice daily To continue Amitriptyline to 100 mg daily Continue  Gabapentin to 300 mg tablet twice daily. To start hydroxyzine 25 mg three time daily for anxiety. May take 50 mg at bedtime for anxiety.   To report adverse symptoms or worsening symptoms Will follow up in 4 months to reassess. Provided emergency contact information. Reviewed PDMP         Elwanda Brooklyn, NP

## 2022-08-08 DIAGNOSIS — J3089 Other allergic rhinitis: Secondary | ICD-10-CM | POA: Diagnosis not present

## 2022-08-08 DIAGNOSIS — J3081 Allergic rhinitis due to animal (cat) (dog) hair and dander: Secondary | ICD-10-CM | POA: Diagnosis not present

## 2022-08-08 DIAGNOSIS — J301 Allergic rhinitis due to pollen: Secondary | ICD-10-CM | POA: Diagnosis not present

## 2022-08-22 DIAGNOSIS — J301 Allergic rhinitis due to pollen: Secondary | ICD-10-CM | POA: Diagnosis not present

## 2022-08-22 DIAGNOSIS — J3089 Other allergic rhinitis: Secondary | ICD-10-CM | POA: Diagnosis not present

## 2022-08-22 DIAGNOSIS — J3081 Allergic rhinitis due to animal (cat) (dog) hair and dander: Secondary | ICD-10-CM | POA: Diagnosis not present

## 2022-09-11 DIAGNOSIS — J301 Allergic rhinitis due to pollen: Secondary | ICD-10-CM | POA: Diagnosis not present

## 2022-09-11 DIAGNOSIS — J3081 Allergic rhinitis due to animal (cat) (dog) hair and dander: Secondary | ICD-10-CM | POA: Diagnosis not present

## 2022-09-11 DIAGNOSIS — J3089 Other allergic rhinitis: Secondary | ICD-10-CM | POA: Diagnosis not present

## 2022-09-20 DIAGNOSIS — J301 Allergic rhinitis due to pollen: Secondary | ICD-10-CM | POA: Diagnosis not present

## 2022-09-20 DIAGNOSIS — J3089 Other allergic rhinitis: Secondary | ICD-10-CM | POA: Diagnosis not present

## 2022-09-20 DIAGNOSIS — J3081 Allergic rhinitis due to animal (cat) (dog) hair and dander: Secondary | ICD-10-CM | POA: Diagnosis not present

## 2022-10-04 DIAGNOSIS — J301 Allergic rhinitis due to pollen: Secondary | ICD-10-CM | POA: Diagnosis not present

## 2022-10-04 DIAGNOSIS — J3089 Other allergic rhinitis: Secondary | ICD-10-CM | POA: Diagnosis not present

## 2022-10-04 DIAGNOSIS — J3081 Allergic rhinitis due to animal (cat) (dog) hair and dander: Secondary | ICD-10-CM | POA: Diagnosis not present

## 2022-10-16 DIAGNOSIS — J301 Allergic rhinitis due to pollen: Secondary | ICD-10-CM | POA: Diagnosis not present

## 2022-10-16 DIAGNOSIS — J3089 Other allergic rhinitis: Secondary | ICD-10-CM | POA: Diagnosis not present

## 2022-10-16 DIAGNOSIS — J3081 Allergic rhinitis due to animal (cat) (dog) hair and dander: Secondary | ICD-10-CM | POA: Diagnosis not present

## 2022-10-22 DIAGNOSIS — D225 Melanocytic nevi of trunk: Secondary | ICD-10-CM | POA: Diagnosis not present

## 2022-10-25 DIAGNOSIS — J3089 Other allergic rhinitis: Secondary | ICD-10-CM | POA: Diagnosis not present

## 2022-10-25 DIAGNOSIS — J3081 Allergic rhinitis due to animal (cat) (dog) hair and dander: Secondary | ICD-10-CM | POA: Diagnosis not present

## 2022-10-25 DIAGNOSIS — J301 Allergic rhinitis due to pollen: Secondary | ICD-10-CM | POA: Diagnosis not present

## 2022-10-29 DIAGNOSIS — H1045 Other chronic allergic conjunctivitis: Secondary | ICD-10-CM | POA: Diagnosis not present

## 2022-10-29 DIAGNOSIS — J3081 Allergic rhinitis due to animal (cat) (dog) hair and dander: Secondary | ICD-10-CM | POA: Diagnosis not present

## 2022-10-29 DIAGNOSIS — J3089 Other allergic rhinitis: Secondary | ICD-10-CM | POA: Diagnosis not present

## 2022-10-29 DIAGNOSIS — J4530 Mild persistent asthma, uncomplicated: Secondary | ICD-10-CM | POA: Diagnosis not present

## 2022-10-29 DIAGNOSIS — J301 Allergic rhinitis due to pollen: Secondary | ICD-10-CM | POA: Diagnosis not present

## 2022-11-06 DIAGNOSIS — J301 Allergic rhinitis due to pollen: Secondary | ICD-10-CM | POA: Diagnosis not present

## 2022-11-06 DIAGNOSIS — J3089 Other allergic rhinitis: Secondary | ICD-10-CM | POA: Diagnosis not present

## 2022-11-06 DIAGNOSIS — J3081 Allergic rhinitis due to animal (cat) (dog) hair and dander: Secondary | ICD-10-CM | POA: Diagnosis not present

## 2022-11-22 DIAGNOSIS — J3089 Other allergic rhinitis: Secondary | ICD-10-CM | POA: Diagnosis not present

## 2022-11-22 DIAGNOSIS — J301 Allergic rhinitis due to pollen: Secondary | ICD-10-CM | POA: Diagnosis not present

## 2022-11-22 DIAGNOSIS — J3081 Allergic rhinitis due to animal (cat) (dog) hair and dander: Secondary | ICD-10-CM | POA: Diagnosis not present

## 2022-11-25 ENCOUNTER — Encounter: Payer: Self-pay | Admitting: *Deleted

## 2022-11-26 ENCOUNTER — Ambulatory Visit: Payer: BC Managed Care – PPO | Admitting: Behavioral Health

## 2022-11-26 ENCOUNTER — Other Ambulatory Visit: Payer: Self-pay

## 2022-11-26 DIAGNOSIS — F330 Major depressive disorder, recurrent, mild: Secondary | ICD-10-CM

## 2022-11-26 DIAGNOSIS — F411 Generalized anxiety disorder: Secondary | ICD-10-CM

## 2022-11-26 DIAGNOSIS — F331 Major depressive disorder, recurrent, moderate: Secondary | ICD-10-CM

## 2022-11-26 DIAGNOSIS — F4542 Pain disorder with related psychological factors: Secondary | ICD-10-CM

## 2022-11-26 MED ORDER — DULOXETINE HCL 20 MG PO CPEP
ORAL_CAPSULE | ORAL | 3 refills | Status: DC
Start: 2022-11-26 — End: 2023-07-06

## 2022-11-26 MED ORDER — GABAPENTIN 300 MG PO CAPS: ORAL_CAPSULE | ORAL | 1 refills | Status: DC

## 2022-11-26 NOTE — Progress Notes (Signed)
I was delayed in traffic and patient agreed to RS. Refills request were handled. No charge for today.

## 2022-11-26 NOTE — Telephone Encounter (Signed)
Pt in office today to see Arlys John for follow up but due to traffic he's unable to see pt. Pt reports medication is working well. He only asked for his Gabapentin refill but I will double check he has refills until his next apt in 3 months. Instructed to call with any issues.

## 2022-11-29 DIAGNOSIS — J3089 Other allergic rhinitis: Secondary | ICD-10-CM | POA: Diagnosis not present

## 2022-11-29 DIAGNOSIS — J301 Allergic rhinitis due to pollen: Secondary | ICD-10-CM | POA: Diagnosis not present

## 2022-11-29 DIAGNOSIS — J3081 Allergic rhinitis due to animal (cat) (dog) hair and dander: Secondary | ICD-10-CM | POA: Diagnosis not present

## 2022-12-03 DIAGNOSIS — Z125 Encounter for screening for malignant neoplasm of prostate: Secondary | ICD-10-CM | POA: Diagnosis not present

## 2022-12-03 DIAGNOSIS — Z13220 Encounter for screening for lipoid disorders: Secondary | ICD-10-CM | POA: Diagnosis not present

## 2022-12-03 DIAGNOSIS — Z0000 Encounter for general adult medical examination without abnormal findings: Secondary | ICD-10-CM | POA: Diagnosis not present

## 2022-12-06 DIAGNOSIS — J301 Allergic rhinitis due to pollen: Secondary | ICD-10-CM | POA: Diagnosis not present

## 2022-12-06 DIAGNOSIS — J3089 Other allergic rhinitis: Secondary | ICD-10-CM | POA: Diagnosis not present

## 2022-12-06 DIAGNOSIS — J3081 Allergic rhinitis due to animal (cat) (dog) hair and dander: Secondary | ICD-10-CM | POA: Diagnosis not present

## 2022-12-18 DIAGNOSIS — D649 Anemia, unspecified: Secondary | ICD-10-CM | POA: Diagnosis not present

## 2022-12-20 DIAGNOSIS — J301 Allergic rhinitis due to pollen: Secondary | ICD-10-CM | POA: Diagnosis not present

## 2022-12-20 DIAGNOSIS — J3089 Other allergic rhinitis: Secondary | ICD-10-CM | POA: Diagnosis not present

## 2022-12-20 DIAGNOSIS — J3081 Allergic rhinitis due to animal (cat) (dog) hair and dander: Secondary | ICD-10-CM | POA: Diagnosis not present

## 2023-01-03 DIAGNOSIS — J3089 Other allergic rhinitis: Secondary | ICD-10-CM | POA: Diagnosis not present

## 2023-01-03 DIAGNOSIS — J301 Allergic rhinitis due to pollen: Secondary | ICD-10-CM | POA: Diagnosis not present

## 2023-01-03 DIAGNOSIS — J3081 Allergic rhinitis due to animal (cat) (dog) hair and dander: Secondary | ICD-10-CM | POA: Diagnosis not present

## 2023-02-05 DIAGNOSIS — J3081 Allergic rhinitis due to animal (cat) (dog) hair and dander: Secondary | ICD-10-CM | POA: Diagnosis not present

## 2023-02-05 DIAGNOSIS — J3089 Other allergic rhinitis: Secondary | ICD-10-CM | POA: Diagnosis not present

## 2023-02-05 DIAGNOSIS — J301 Allergic rhinitis due to pollen: Secondary | ICD-10-CM | POA: Diagnosis not present

## 2023-02-25 ENCOUNTER — Ambulatory Visit: Payer: BC Managed Care – PPO | Admitting: Behavioral Health

## 2023-02-25 ENCOUNTER — Encounter: Payer: Self-pay | Admitting: Behavioral Health

## 2023-02-25 DIAGNOSIS — F4542 Pain disorder with related psychological factors: Secondary | ICD-10-CM | POA: Diagnosis not present

## 2023-02-25 DIAGNOSIS — F411 Generalized anxiety disorder: Secondary | ICD-10-CM

## 2023-02-25 DIAGNOSIS — J3081 Allergic rhinitis due to animal (cat) (dog) hair and dander: Secondary | ICD-10-CM | POA: Diagnosis not present

## 2023-02-25 DIAGNOSIS — J301 Allergic rhinitis due to pollen: Secondary | ICD-10-CM | POA: Diagnosis not present

## 2023-02-25 DIAGNOSIS — J3089 Other allergic rhinitis: Secondary | ICD-10-CM | POA: Diagnosis not present

## 2023-02-25 NOTE — Progress Notes (Signed)
[facility]  Patient ID: Travis Reeves,  MRN: [id number]  PCP: Tally Joe, MD  Date of Evaluation: 02/25/2023 Time spent:20 minutes  Chief Complaint:  Chief Complaint   Anxiety; Depression; Follow-up; Patient Education     HISTORY/CURRENT STATUS: HPI 52 year old male presents to this office for follow up and medication management. Says he has been doing very well with anxiety and depression. He has utilized hydroxyzine a few times when anxiety is increased. It has helped with his mid sternal possible gastric pain that occurs.   No changes in his social life.  Requesting no medication changes this visit.  He says his anxiety is 2/10 and depression is 0/10.   He is sleeping 7-8 hours per night. 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 No mania, no psychosis, no SI/HI. Agrees to follow up in 6 months.   No prior medication trials:     Individual Medical History/ Review of Systems: Changes? :No   Allergies: Zithromax [azithromycin dihydrate]  Current Medications:  Current Outpatient Medications:    alfuzosin (UROXATRAL) 10 MG 24 hr tablet, Take 10 mg by mouth daily. Take 1 daily., Disp: , Rfl:    amitriptyline (ELAVIL) 100 MG tablet, Take 1 tablet (100 mg total) by mouth at bedtime., Disp: 90 tablet, Rfl: 1   cetirizine (ZYRTEC) 10 MG tablet, Take 10 mg by mouth daily., Disp: , Rfl:    doxycycline (VIBRA-TABS) 100 MG tablet, Take 1 tablet (100 mg total) by mouth 2 (two) times daily., Disp: 15 tablet, Rfl: 1   DULoxetine (CYMBALTA) 20 MG capsule, Take one capsule after breakfast and one after dinner for total of 40 mg daily., Disp: 60 capsule, Rfl: 3   EPINEPHrine 0.3 mg/0.3 mL IJ SOAJ injection, SMARTSIG:Injection IM As Directed, Disp: , Rfl:    esomeprazole (NEXIUM) 40 MG capsule, Take 1 capsule (40 mg total) by mouth daily before breakfast., Disp: 90 capsule, Rfl: 3   fluticasone (FLONASE) 50 MCG/ACT nasal spray, Place 1 spray into both nostrils  daily. Must see new provider for future refills, Disp: 16 g, Rfl: 0   gabapentin (NEURONTIN) 300 MG capsule, Take one capsule by mouth twice daily., Disp: 180 capsule, Rfl: 1   hydrOXYzine (ATARAX) 25 MG tablet, Take 1 tablet (25 mg total) by mouth 3 (three) times daily as needed., Disp: 30 tablet, Rfl: 3   SYMBICORT 80-4.5 MCG/ACT inhaler, Inhale into the lungs., Disp: , Rfl:  Medication Side Effects: none  Family Medical/ Social History: Changes? No  MENTAL HEALTH EXAM:  There were no vitals taken for this visit.There is no height or weight on file to calculate BMI.  General Appearance: Casual, Neat, and Well Groomed  Eye Contact:  Good  Speech:  Clear and Coherent  Volume:  Normal  Mood:  NA  Affect:  Appropriate  Thought Process:  Coherent  Orientation:  Full (Time, Place, and Person)  Thought Content: Logical   Suicidal Thoughts:  No  Homicidal Thoughts:  No  Memory:  WNL  Judgement:  Good  Insight:  Good  Psychomotor Activity:  Normal  Concentration:  Concentration: Good  Recall:  Good  Fund of Knowledge: Good  Language: Good  Assets:  Desire for Improvement  ADL's:  Intact  Cognition: WNL  Prognosis:  Good    DIAGNOSES: No diagnosis found.  Receiving Psychotherapy: No    RECOMMENDATIONS:   Greater than 50% of 20 min  face to face time with patient was spent on  counseling and coordination of care. He feels like his anxiety and depression is still stable.  He says that he increased his amitriptyline again to 100 mg at bedtime. We discussed a mid sternal  pain or discomfort he gets at bedtime. He has utilized hydroxyzine on a few occasions and it has helped.  Continue Cymbalta 20 mg twice daily To continue Amitriptyline to 100 mg daily Continue  Gabapentin to 300 mg tablet twice daily. To continue hydroxyzine 25 mg three time daily for anxiety. May take 50 mg at bedtime for anxiety.   To report adverse symptoms or worsening symptoms Will follow up in 6  months to  reassess. Provided emergency contact information. Reviewed PDMP           Joan Flores, NP

## 2023-03-17 DIAGNOSIS — J3089 Other allergic rhinitis: Secondary | ICD-10-CM | POA: Diagnosis not present

## 2023-03-17 DIAGNOSIS — J3081 Allergic rhinitis due to animal (cat) (dog) hair and dander: Secondary | ICD-10-CM | POA: Diagnosis not present

## 2023-03-17 DIAGNOSIS — J301 Allergic rhinitis due to pollen: Secondary | ICD-10-CM | POA: Diagnosis not present

## 2023-03-27 DIAGNOSIS — J301 Allergic rhinitis due to pollen: Secondary | ICD-10-CM | POA: Diagnosis not present

## 2023-03-27 DIAGNOSIS — J3081 Allergic rhinitis due to animal (cat) (dog) hair and dander: Secondary | ICD-10-CM | POA: Diagnosis not present

## 2023-03-27 DIAGNOSIS — J3089 Other allergic rhinitis: Secondary | ICD-10-CM | POA: Diagnosis not present

## 2023-04-25 DIAGNOSIS — J301 Allergic rhinitis due to pollen: Secondary | ICD-10-CM | POA: Diagnosis not present

## 2023-04-25 DIAGNOSIS — J3089 Other allergic rhinitis: Secondary | ICD-10-CM | POA: Diagnosis not present

## 2023-04-25 DIAGNOSIS — J3081 Allergic rhinitis due to animal (cat) (dog) hair and dander: Secondary | ICD-10-CM | POA: Diagnosis not present

## 2023-04-30 DIAGNOSIS — J3089 Other allergic rhinitis: Secondary | ICD-10-CM | POA: Diagnosis not present

## 2023-04-30 DIAGNOSIS — J301 Allergic rhinitis due to pollen: Secondary | ICD-10-CM | POA: Diagnosis not present

## 2023-04-30 DIAGNOSIS — J3081 Allergic rhinitis due to animal (cat) (dog) hair and dander: Secondary | ICD-10-CM | POA: Diagnosis not present

## 2023-05-16 DIAGNOSIS — J301 Allergic rhinitis due to pollen: Secondary | ICD-10-CM | POA: Diagnosis not present

## 2023-05-16 DIAGNOSIS — J3089 Other allergic rhinitis: Secondary | ICD-10-CM | POA: Diagnosis not present

## 2023-05-16 DIAGNOSIS — J3081 Allergic rhinitis due to animal (cat) (dog) hair and dander: Secondary | ICD-10-CM | POA: Diagnosis not present

## 2023-06-03 DIAGNOSIS — J301 Allergic rhinitis due to pollen: Secondary | ICD-10-CM | POA: Diagnosis not present

## 2023-06-03 DIAGNOSIS — J3081 Allergic rhinitis due to animal (cat) (dog) hair and dander: Secondary | ICD-10-CM | POA: Diagnosis not present

## 2023-06-03 DIAGNOSIS — J3089 Other allergic rhinitis: Secondary | ICD-10-CM | POA: Diagnosis not present

## 2023-07-06 ENCOUNTER — Other Ambulatory Visit: Payer: Self-pay | Admitting: Behavioral Health

## 2023-07-06 DIAGNOSIS — F330 Major depressive disorder, recurrent, mild: Secondary | ICD-10-CM

## 2023-07-06 DIAGNOSIS — F411 Generalized anxiety disorder: Secondary | ICD-10-CM

## 2023-07-06 DIAGNOSIS — F331 Major depressive disorder, recurrent, moderate: Secondary | ICD-10-CM

## 2023-07-06 DIAGNOSIS — F4542 Pain disorder with related psychological factors: Secondary | ICD-10-CM

## 2023-07-07 DIAGNOSIS — Z23 Encounter for immunization: Secondary | ICD-10-CM | POA: Diagnosis not present

## 2023-07-21 DIAGNOSIS — J3081 Allergic rhinitis due to animal (cat) (dog) hair and dander: Secondary | ICD-10-CM | POA: Diagnosis not present

## 2023-07-21 DIAGNOSIS — J3089 Other allergic rhinitis: Secondary | ICD-10-CM | POA: Diagnosis not present

## 2023-07-21 DIAGNOSIS — J301 Allergic rhinitis due to pollen: Secondary | ICD-10-CM | POA: Diagnosis not present

## 2023-07-24 DIAGNOSIS — J3081 Allergic rhinitis due to animal (cat) (dog) hair and dander: Secondary | ICD-10-CM | POA: Diagnosis not present

## 2023-07-24 DIAGNOSIS — J3089 Other allergic rhinitis: Secondary | ICD-10-CM | POA: Diagnosis not present

## 2023-07-24 DIAGNOSIS — J301 Allergic rhinitis due to pollen: Secondary | ICD-10-CM | POA: Diagnosis not present

## 2023-08-01 DIAGNOSIS — J3089 Other allergic rhinitis: Secondary | ICD-10-CM | POA: Diagnosis not present

## 2023-08-01 DIAGNOSIS — J3081 Allergic rhinitis due to animal (cat) (dog) hair and dander: Secondary | ICD-10-CM | POA: Diagnosis not present

## 2023-08-20 DIAGNOSIS — J301 Allergic rhinitis due to pollen: Secondary | ICD-10-CM | POA: Diagnosis not present

## 2023-08-20 DIAGNOSIS — J3081 Allergic rhinitis due to animal (cat) (dog) hair and dander: Secondary | ICD-10-CM | POA: Diagnosis not present

## 2023-08-20 DIAGNOSIS — J3089 Other allergic rhinitis: Secondary | ICD-10-CM | POA: Diagnosis not present

## 2023-08-21 DIAGNOSIS — J00 Acute nasopharyngitis [common cold]: Secondary | ICD-10-CM | POA: Diagnosis not present

## 2023-08-21 DIAGNOSIS — Z6821 Body mass index (BMI) 21.0-21.9, adult: Secondary | ICD-10-CM | POA: Diagnosis not present

## 2023-08-21 DIAGNOSIS — H10022 Other mucopurulent conjunctivitis, left eye: Secondary | ICD-10-CM | POA: Diagnosis not present

## 2023-08-29 ENCOUNTER — Other Ambulatory Visit: Payer: Self-pay | Admitting: Behavioral Health

## 2023-08-29 DIAGNOSIS — F411 Generalized anxiety disorder: Secondary | ICD-10-CM

## 2023-08-29 DIAGNOSIS — F4542 Pain disorder with related psychological factors: Secondary | ICD-10-CM

## 2023-08-29 DIAGNOSIS — F330 Major depressive disorder, recurrent, mild: Secondary | ICD-10-CM

## 2023-09-08 ENCOUNTER — Ambulatory Visit: Payer: BC Managed Care – PPO | Admitting: Behavioral Health

## 2023-09-12 DIAGNOSIS — K219 Gastro-esophageal reflux disease without esophagitis: Secondary | ICD-10-CM | POA: Diagnosis not present

## 2023-09-16 ENCOUNTER — Ambulatory Visit: Payer: BC Managed Care – PPO | Admitting: Behavioral Health

## 2023-09-16 ENCOUNTER — Encounter: Payer: Self-pay | Admitting: Behavioral Health

## 2023-09-16 DIAGNOSIS — F331 Major depressive disorder, recurrent, moderate: Secondary | ICD-10-CM | POA: Diagnosis not present

## 2023-09-16 DIAGNOSIS — F4542 Pain disorder with related psychological factors: Secondary | ICD-10-CM

## 2023-09-16 DIAGNOSIS — F330 Major depressive disorder, recurrent, mild: Secondary | ICD-10-CM | POA: Diagnosis not present

## 2023-09-16 DIAGNOSIS — F411 Generalized anxiety disorder: Secondary | ICD-10-CM | POA: Diagnosis not present

## 2023-09-16 MED ORDER — GABAPENTIN 300 MG PO CAPS: 300.0000 mg | ORAL_CAPSULE | Freq: Two times a day (BID) | ORAL | 1 refills | Status: DC

## 2023-09-16 MED ORDER — AMITRIPTYLINE HCL 100 MG PO TABS
100.0000 mg | ORAL_TABLET | Freq: Every day | ORAL | 1 refills | Status: DC
Start: 2023-09-16 — End: 2024-03-15

## 2023-09-16 MED ORDER — DULOXETINE HCL 20 MG PO CPEP
ORAL_CAPSULE | ORAL | 1 refills | Status: DC
Start: 2023-09-16 — End: 2024-03-15

## 2023-09-16 NOTE — Progress Notes (Signed)
 [facility]  Patient ID: Travis Reeves,  MRN: [id number]  PCP: Seabron Lenis, MD  Date of Evaluation: 09/16/2023 Time spent:30 minutes  Chief Complaint:  Chief Complaint   Anxiety; Depression; Follow-up; Medication Refill; Patient Education     HISTORY/CURRENT STATUS: HPI 53 year old male presents to this office for follow up and medication management. Says he has been doing very well with anxiety and depression. He has utilized hydroxyzine  a few times when anxiety is increased. It has helped with his mid sternal possible gastric pain that occurs.   No changes in his social life.  Requesting no medication changes this visit.  He says his anxiety is 2/10 and depression is 0/10.   He is sleeping 7-8 hours per night. 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 No mania, no psychosis, no SI/HI. Agrees to follow up in 6 months.   No prior medication trials: Individual Medical History/ Review of Systems: Changes? :No   Allergies: Zithromax [azithromycin dihydrate]  Current Medications:  Current Outpatient Medications:    alfuzosin (UROXATRAL) 10 MG 24 hr tablet, Take 10 mg by mouth daily. Take 1 daily., Disp: , Rfl:    amitriptyline  (ELAVIL ) 100 MG tablet, Take 1 tablet (100 mg total) by mouth at bedtime., Disp: 90 tablet, Rfl: 1   cetirizine (ZYRTEC) 10 MG tablet, Take 10 mg by mouth daily., Disp: , Rfl:    doxycycline  (VIBRA -TABS) 100 MG tablet, Take 1 tablet (100 mg total) by mouth 2 (two) times daily., Disp: 15 tablet, Rfl: 1   DULoxetine  (CYMBALTA ) 20 MG capsule, Take one capsule after breakfast and one after dinner for total of 40 mg daily., Disp: 180 capsule, Rfl: 1   EPINEPHrine 0.3 mg/0.3 mL IJ SOAJ injection, SMARTSIG:Injection IM As Directed, Disp: , Rfl:    esomeprazole  (NEXIUM ) 40 MG capsule, Take 1 capsule (40 mg total) by mouth daily before breakfast., Disp: 90 capsule, Rfl: 3   fluticasone  (FLONASE ) 50 MCG/ACT nasal spray, Place 1 spray into  both nostrils daily. Must see new provider for future refills, Disp: 16 g, Rfl: 0   gabapentin  (NEURONTIN ) 300 MG capsule, Take 1 capsule (300 mg total) by mouth 2 (two) times daily., Disp: 60 capsule, Rfl: 1   SYMBICORT 80-4.5 MCG/ACT inhaler, Inhale into the lungs., Disp: , Rfl:  Medication Side Effects: none  Family Medical/ Social History: Changes? No  MENTAL HEALTH EXAM:  There were no vitals taken for this visit.There is no height or weight on file to calculate BMI.  General Appearance: Casual and Well Groomed  Eye Contact:  Good  Speech:  Clear and Coherent  Volume:  Normal  Mood:  NA  Affect:  Appropriate  Thought Process:  Coherent  Orientation:  Full (Time, Place, and Person)  Thought Content: Logical   Suicidal Thoughts:  No  Homicidal Thoughts:  No  Memory:  WNL  Judgement:  Good  Insight:  Good  Psychomotor Activity:  Normal  Concentration:  Concentration: Good  Recall:  Good  Fund of Knowledge: Good  Language: Good  Assets:  Desire for Improvement  ADL's:  Intact  Cognition: WNL  Prognosis:  Good    DIAGNOSES:    ICD-10-CM   1. Generalized anxiety disorder  F41.1 gabapentin  (NEURONTIN ) 300 MG capsule    amitriptyline  (ELAVIL ) 100 MG tablet    DULoxetine  (CYMBALTA ) 20 MG capsule    2. Pain disorders related to psychological factors  F45.42 gabapentin  (NEURONTIN ) 300 MG capsule  amitriptyline  (ELAVIL ) 100 MG tablet    DULoxetine  (CYMBALTA ) 20 MG capsule    3. Mild episode of recurrent major depressive disorder (HCC)  F33.0 gabapentin  (NEURONTIN ) 300 MG capsule    DULoxetine  (CYMBALTA ) 20 MG capsule    4. Major depressive disorder, recurrent episode, moderate (HCC)  F33.1 amitriptyline  (ELAVIL ) 100 MG tablet      Receiving Psychotherapy: No    RECOMMENDATIONS:    Greater than 50% of 30 min face to face time with patient was spent on counseling and coordination of care. He feels like his anxiety and depression is still stable.   We discussed a mid  sternal  pain or discomfort he gets at bedtime. Drinking water or milk helps.  He is happy with his medication.  Continue Cymbalta  20 mg twice daily To continue Amitriptyline  to 100 mg daily Continue  Gabapentin  to 300 mg tablet twice daily. To continue hydroxyzine  25 mg three time daily for anxiety. May take 50 mg at bedtime for anxiety.   To report adverse symptoms or worsening symptoms Will follow up in 6  months to reassess. Provided emergency contact information. Reviewed PDMP    Redell DELENA Pizza, NP

## 2023-10-15 DIAGNOSIS — J3081 Allergic rhinitis due to animal (cat) (dog) hair and dander: Secondary | ICD-10-CM | POA: Diagnosis not present

## 2023-10-15 DIAGNOSIS — J3089 Other allergic rhinitis: Secondary | ICD-10-CM | POA: Diagnosis not present

## 2023-10-15 DIAGNOSIS — J301 Allergic rhinitis due to pollen: Secondary | ICD-10-CM | POA: Diagnosis not present

## 2023-10-24 DIAGNOSIS — J3089 Other allergic rhinitis: Secondary | ICD-10-CM | POA: Diagnosis not present

## 2023-10-24 DIAGNOSIS — J3081 Allergic rhinitis due to animal (cat) (dog) hair and dander: Secondary | ICD-10-CM | POA: Diagnosis not present

## 2023-10-24 DIAGNOSIS — J301 Allergic rhinitis due to pollen: Secondary | ICD-10-CM | POA: Diagnosis not present

## 2023-10-29 DIAGNOSIS — J301 Allergic rhinitis due to pollen: Secondary | ICD-10-CM | POA: Diagnosis not present

## 2023-10-29 DIAGNOSIS — J4530 Mild persistent asthma, uncomplicated: Secondary | ICD-10-CM | POA: Diagnosis not present

## 2023-10-29 DIAGNOSIS — J3089 Other allergic rhinitis: Secondary | ICD-10-CM | POA: Diagnosis not present

## 2023-10-29 DIAGNOSIS — J3081 Allergic rhinitis due to animal (cat) (dog) hair and dander: Secondary | ICD-10-CM | POA: Diagnosis not present

## 2023-11-04 NOTE — Progress Notes (Deleted)
    Aleen Sells D.[facility] [address] [place] [address] Phone: [phone]   Assessment and Plan:     There are no diagnoses linked to this encounter.  ***   Pertinent previous records reviewed include ***    Follow Up: ***     Subjective:   I, Chanel Mckesson, am serving as a Neurosurgeon for Doctor Richardean Sale  Chief Complaint: left knee pain   HPI:   11/05/2023 Patient is a 53 year old male with left knee pain. Patient states  Relevant Historical Information: ***  Additional pertinent review of systems negative.   Current Outpatient Medications:    alfuzosin (UROXATRAL) 10 MG 24 hr tablet, Take 10 mg by mouth daily. Take 1 daily., Disp: , Rfl:    amitriptyline (ELAVIL) 100 MG tablet, Take 1 tablet (100 mg total) by mouth at bedtime., Disp: 90 tablet, Rfl: 1   cetirizine (ZYRTEC) 10 MG tablet, Take 10 mg by mouth daily., Disp: , Rfl:    doxycycline (VIBRA-TABS) 100 MG tablet, Take 1 tablet (100 mg total) by mouth 2 (two) times daily., Disp: 15 tablet, Rfl: 1   DULoxetine (CYMBALTA) 20 MG capsule, Take one capsule after breakfast and one after dinner for total of 40 mg daily., Disp: 180 capsule, Rfl: 1   EPINEPHrine 0.3 mg/0.3 mL IJ SOAJ injection, SMARTSIG:Injection IM As Directed, Disp: , Rfl:    esomeprazole (NEXIUM) 40 MG capsule, Take 1 capsule (40 mg total) by mouth daily before breakfast., Disp: 90 capsule, Rfl: 3   fluticasone (FLONASE) 50 MCG/ACT nasal spray, Place 1 spray into both nostrils daily. Must see new provider for future refills, Disp: 16 g, Rfl: 0   gabapentin (NEURONTIN) 300 MG capsule, Take 1 capsule (300 mg total) by mouth 2 (two) times daily., Disp: 60 capsule, Rfl: 1   SYMBICORT 80-4.5 MCG/ACT inhaler, Inhale into the lungs., Disp: , Rfl:    Objective:     There were no vitals filed for this visit.    There is no height or weight on file to calculate BMI.    Physical Exam:     ***   Electronically signed by:  Aleen Sells D.[facility] 7:33 AM 11/04/23

## 2023-11-05 ENCOUNTER — Ambulatory Visit: Admitting: Sports Medicine

## 2023-11-07 DIAGNOSIS — J3089 Other allergic rhinitis: Secondary | ICD-10-CM | POA: Diagnosis not present

## 2023-11-07 DIAGNOSIS — J3081 Allergic rhinitis due to animal (cat) (dog) hair and dander: Secondary | ICD-10-CM | POA: Diagnosis not present

## 2023-11-07 DIAGNOSIS — J301 Allergic rhinitis due to pollen: Secondary | ICD-10-CM | POA: Diagnosis not present

## 2023-11-11 DIAGNOSIS — D225 Melanocytic nevi of trunk: Secondary | ICD-10-CM | POA: Diagnosis not present

## 2023-11-11 DIAGNOSIS — L821 Other seborrheic keratosis: Secondary | ICD-10-CM | POA: Diagnosis not present

## 2023-11-11 DIAGNOSIS — Z1283 Encounter for screening for malignant neoplasm of skin: Secondary | ICD-10-CM | POA: Diagnosis not present

## 2023-11-21 DIAGNOSIS — J3081 Allergic rhinitis due to animal (cat) (dog) hair and dander: Secondary | ICD-10-CM | POA: Diagnosis not present

## 2023-11-21 DIAGNOSIS — J301 Allergic rhinitis due to pollen: Secondary | ICD-10-CM | POA: Diagnosis not present

## 2023-11-21 DIAGNOSIS — J3089 Other allergic rhinitis: Secondary | ICD-10-CM | POA: Diagnosis not present

## 2023-11-29 ENCOUNTER — Other Ambulatory Visit: Payer: Self-pay | Admitting: Behavioral Health

## 2023-11-29 DIAGNOSIS — F411 Generalized anxiety disorder: Secondary | ICD-10-CM

## 2023-11-29 DIAGNOSIS — F330 Major depressive disorder, recurrent, mild: Secondary | ICD-10-CM

## 2023-11-29 DIAGNOSIS — F4542 Pain disorder with related psychological factors: Secondary | ICD-10-CM

## 2023-12-05 DIAGNOSIS — J3089 Other allergic rhinitis: Secondary | ICD-10-CM | POA: Diagnosis not present

## 2023-12-05 DIAGNOSIS — J3081 Allergic rhinitis due to animal (cat) (dog) hair and dander: Secondary | ICD-10-CM | POA: Diagnosis not present

## 2023-12-05 DIAGNOSIS — J301 Allergic rhinitis due to pollen: Secondary | ICD-10-CM | POA: Diagnosis not present

## 2023-12-11 DIAGNOSIS — J3081 Allergic rhinitis due to animal (cat) (dog) hair and dander: Secondary | ICD-10-CM | POA: Diagnosis not present

## 2023-12-11 DIAGNOSIS — J3089 Other allergic rhinitis: Secondary | ICD-10-CM | POA: Diagnosis not present

## 2023-12-11 DIAGNOSIS — J301 Allergic rhinitis due to pollen: Secondary | ICD-10-CM | POA: Diagnosis not present

## 2023-12-12 DIAGNOSIS — Z125 Encounter for screening for malignant neoplasm of prostate: Secondary | ICD-10-CM | POA: Diagnosis not present

## 2023-12-12 DIAGNOSIS — Z13220 Encounter for screening for lipoid disorders: Secondary | ICD-10-CM | POA: Diagnosis not present

## 2023-12-12 DIAGNOSIS — R400 Somnolence: Secondary | ICD-10-CM | POA: Diagnosis not present

## 2023-12-12 DIAGNOSIS — Z0000 Encounter for general adult medical examination without abnormal findings: Secondary | ICD-10-CM | POA: Diagnosis not present

## 2023-12-12 DIAGNOSIS — N400 Enlarged prostate without lower urinary tract symptoms: Secondary | ICD-10-CM | POA: Diagnosis not present

## 2023-12-12 DIAGNOSIS — R0683 Snoring: Secondary | ICD-10-CM | POA: Diagnosis not present

## 2023-12-12 DIAGNOSIS — K219 Gastro-esophageal reflux disease without esophagitis: Secondary | ICD-10-CM | POA: Diagnosis not present

## 2023-12-12 DIAGNOSIS — Z23 Encounter for immunization: Secondary | ICD-10-CM | POA: Diagnosis not present

## 2023-12-23 DIAGNOSIS — R0683 Snoring: Secondary | ICD-10-CM | POA: Diagnosis not present

## 2023-12-26 DIAGNOSIS — J301 Allergic rhinitis due to pollen: Secondary | ICD-10-CM | POA: Diagnosis not present

## 2023-12-26 DIAGNOSIS — J3089 Other allergic rhinitis: Secondary | ICD-10-CM | POA: Diagnosis not present

## 2023-12-26 DIAGNOSIS — J3081 Allergic rhinitis due to animal (cat) (dog) hair and dander: Secondary | ICD-10-CM | POA: Diagnosis not present

## 2024-01-08 DIAGNOSIS — J301 Allergic rhinitis due to pollen: Secondary | ICD-10-CM | POA: Diagnosis not present

## 2024-01-08 DIAGNOSIS — J3081 Allergic rhinitis due to animal (cat) (dog) hair and dander: Secondary | ICD-10-CM | POA: Diagnosis not present

## 2024-01-08 DIAGNOSIS — J3089 Other allergic rhinitis: Secondary | ICD-10-CM | POA: Diagnosis not present

## 2024-01-16 DIAGNOSIS — J3081 Allergic rhinitis due to animal (cat) (dog) hair and dander: Secondary | ICD-10-CM | POA: Diagnosis not present

## 2024-01-16 DIAGNOSIS — J3089 Other allergic rhinitis: Secondary | ICD-10-CM | POA: Diagnosis not present

## 2024-01-16 DIAGNOSIS — J301 Allergic rhinitis due to pollen: Secondary | ICD-10-CM | POA: Diagnosis not present

## 2024-02-06 DIAGNOSIS — J3089 Other allergic rhinitis: Secondary | ICD-10-CM | POA: Diagnosis not present

## 2024-02-06 DIAGNOSIS — J301 Allergic rhinitis due to pollen: Secondary | ICD-10-CM | POA: Diagnosis not present

## 2024-02-06 DIAGNOSIS — J3081 Allergic rhinitis due to animal (cat) (dog) hair and dander: Secondary | ICD-10-CM | POA: Diagnosis not present

## 2024-02-23 DIAGNOSIS — G4733 Obstructive sleep apnea (adult) (pediatric): Secondary | ICD-10-CM | POA: Diagnosis not present

## 2024-02-27 DIAGNOSIS — G4733 Obstructive sleep apnea (adult) (pediatric): Secondary | ICD-10-CM | POA: Diagnosis not present

## 2024-03-05 DIAGNOSIS — J3081 Allergic rhinitis due to animal (cat) (dog) hair and dander: Secondary | ICD-10-CM | POA: Diagnosis not present

## 2024-03-05 DIAGNOSIS — J301 Allergic rhinitis due to pollen: Secondary | ICD-10-CM | POA: Diagnosis not present

## 2024-03-05 DIAGNOSIS — J3089 Other allergic rhinitis: Secondary | ICD-10-CM | POA: Diagnosis not present

## 2024-03-15 ENCOUNTER — Ambulatory Visit: Payer: BC Managed Care – PPO | Admitting: Behavioral Health

## 2024-03-15 ENCOUNTER — Encounter: Payer: Self-pay | Admitting: Behavioral Health

## 2024-03-15 DIAGNOSIS — F331 Major depressive disorder, recurrent, moderate: Secondary | ICD-10-CM

## 2024-03-15 DIAGNOSIS — F330 Major depressive disorder, recurrent, mild: Secondary | ICD-10-CM

## 2024-03-15 DIAGNOSIS — F411 Generalized anxiety disorder: Secondary | ICD-10-CM

## 2024-03-15 DIAGNOSIS — F4542 Pain disorder with related psychological factors: Secondary | ICD-10-CM

## 2024-03-15 MED ORDER — AMITRIPTYLINE HCL 100 MG PO TABS
100.0000 mg | ORAL_TABLET | Freq: Every day | ORAL | 1 refills | Status: AC
Start: 2024-03-15 — End: ?

## 2024-03-15 MED ORDER — DULOXETINE HCL 20 MG PO CPEP: ORAL_CAPSULE | ORAL | 1 refills | Status: DC

## 2024-03-15 MED ORDER — GABAPENTIN 300 MG PO CAPS
300.0000 mg | ORAL_CAPSULE | Freq: Two times a day (BID) | ORAL | 1 refills | Status: AC
Start: 2024-03-15 — End: ?

## 2024-03-15 NOTE — Progress Notes (Signed)
 [facility]  Patient ID: Travis Reeves,  MRN: [id number]  PCP: Seabron Lenis, MD  Date of Evaluation: 03/15/2024 Time spent:30 minutes  Chief Complaint:  Chief Complaint   Depression; Anxiety; Follow-up; Medication Refill; Patient Education     HISTORY/CURRENT STATUS: HPI  53 year old male presents to this office for follow up and medication management. Says he has been doing very well with anxiety and depression. He has utilized hydroxyzine  a few times when anxiety is increased.   No changes in his social life.  Requesting no medication changes this visit. Say that he may try to wean down on his gabapentin . Will notify this office if successful.  He says his anxiety is 2/10 and depression is 0/10.   He is sleeping 7-8 hours per night. 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 No mania, no psychosis, no SI/HI. Agrees to follow up in 6 months.   No prior medication trials:    Individual Medical History/ Review of Systems: Changes? :No   Allergies: Zithromax [azithromycin dihydrate]  Current Medications:  Current Outpatient Medications:    alfuzosin (UROXATRAL) 10 MG 24 hr tablet, Take 10 mg by mouth daily. Take 1 daily., Disp: , Rfl:    amitriptyline  (ELAVIL ) 100 MG tablet, Take 1 tablet (100 mg total) by mouth at bedtime., Disp: 90 tablet, Rfl: 1   cetirizine (ZYRTEC) 10 MG tablet, Take 10 mg by mouth daily., Disp: , Rfl:    doxycycline  (VIBRA -TABS) 100 MG tablet, Take 1 tablet (100 mg total) by mouth 2 (two) times daily., Disp: 15 tablet, Rfl: 1   DULoxetine  (CYMBALTA ) 20 MG capsule, Take one capsule after breakfast and one after dinner for total of 40 mg daily., Disp: 180 capsule, Rfl: 1   EPINEPHrine 0.3 mg/0.3 mL IJ SOAJ injection, SMARTSIG:Injection IM As Directed, Disp: , Rfl:    esomeprazole  (NEXIUM ) 40 MG capsule, Take 1 capsule (40 mg total) by mouth daily before breakfast., Disp: 90 capsule, Rfl: 3   fluticasone  (FLONASE ) 50 MCG/ACT nasal  spray, Place 1 spray into both nostrils daily. Must see new provider for future refills, Disp: 16 g, Rfl: 0   gabapentin  (NEURONTIN ) 300 MG capsule, Take 1 capsule (300 mg total) by mouth 2 (two) times daily., Disp: 180 capsule, Rfl: 1   SYMBICORT 80-4.5 MCG/ACT inhaler, Inhale into the lungs., Disp: , Rfl:  Medication Side Effects: none  Family Medical/ Social History: Changes? No  MENTAL HEALTH EXAM:  There were no vitals taken for this visit.There is no height or weight on file to calculate BMI.  General Appearance: Casual and Neat  Eye Contact:  NA  Speech:  Clear and Coherent  Volume:  Normal  Mood:  Angry  Affect:  Appropriate  Thought Process:  Coherent  Orientation:  Full (Time, Place, and Person)  Thought Content: Logical   Suicidal Thoughts:  No  Homicidal Thoughts:  No  Memory:  WNL  Judgement:  Good  Insight:  Good  Psychomotor Activity:  Normal  Concentration:  Concentration: Good  Recall:  Good  Fund of Knowledge: Good  Language: Good  Assets:  Desire for Improvement  ADL's:  Intact  Cognition: WNL  Prognosis:  Good    DIAGNOSES:    ICD-10-CM   1. Generalized anxiety disorder  F41.1 gabapentin  (NEURONTIN ) 300 MG capsule    amitriptyline  (ELAVIL ) 100 MG tablet    DULoxetine  (CYMBALTA ) 20 MG capsule    2. Pain disorders related to psychological factors  F45.42  gabapentin  (NEURONTIN ) 300 MG capsule    amitriptyline  (ELAVIL ) 100 MG tablet    DULoxetine  (CYMBALTA ) 20 MG capsule    3. Mild episode of recurrent major depressive disorder (HCC)  F33.0 gabapentin  (NEURONTIN ) 300 MG capsule    DULoxetine  (CYMBALTA ) 20 MG capsule    4. Major depressive disorder, recurrent episode, moderate (HCC)  F33.1 amitriptyline  (ELAVIL ) 100 MG tablet      Receiving Psychotherapy: No    RECOMMENDATIONS:  Greater than 50% of 30 min face to face time with patient was spent on counseling and coordination of care. He feels like his anxiety and depression are still stable.    Agreed to: Continue Cymbalta  20 mg twice daily To continue Amitriptyline  to 100 mg daily Continue  Gabapentin  to 300 mg tablet twice daily. Will reduce to one tablet per day if he is able to.  To continue hydroxyzine  25 mg three time daily for anxiety. May take 50 mg at bedtime for anxiety.   To report adverse symptoms or worsening symptoms Will follow up in 6  months to reassess. Provided emergency contact information. Reviewed PDMP    Redell DELENA Pizza, NP

## 2024-04-08 DIAGNOSIS — G4733 Obstructive sleep apnea (adult) (pediatric): Secondary | ICD-10-CM | POA: Diagnosis not present

## 2024-04-29 DIAGNOSIS — G4733 Obstructive sleep apnea (adult) (pediatric): Secondary | ICD-10-CM | POA: Diagnosis not present

## 2024-05-27 DIAGNOSIS — G4733 Obstructive sleep apnea (adult) (pediatric): Secondary | ICD-10-CM | POA: Diagnosis not present

## 2024-06-01 DIAGNOSIS — J301 Allergic rhinitis due to pollen: Secondary | ICD-10-CM | POA: Diagnosis not present

## 2024-06-01 DIAGNOSIS — J3081 Allergic rhinitis due to animal (cat) (dog) hair and dander: Secondary | ICD-10-CM | POA: Diagnosis not present

## 2024-06-25 DIAGNOSIS — J3081 Allergic rhinitis due to animal (cat) (dog) hair and dander: Secondary | ICD-10-CM | POA: Diagnosis not present

## 2024-06-25 DIAGNOSIS — J301 Allergic rhinitis due to pollen: Secondary | ICD-10-CM | POA: Diagnosis not present

## 2024-06-25 DIAGNOSIS — J3089 Other allergic rhinitis: Secondary | ICD-10-CM | POA: Diagnosis not present

## 2024-07-16 DIAGNOSIS — J301 Allergic rhinitis due to pollen: Secondary | ICD-10-CM | POA: Diagnosis not present

## 2024-07-16 DIAGNOSIS — J3081 Allergic rhinitis due to animal (cat) (dog) hair and dander: Secondary | ICD-10-CM | POA: Diagnosis not present

## 2024-07-16 DIAGNOSIS — J3089 Other allergic rhinitis: Secondary | ICD-10-CM | POA: Diagnosis not present

## 2024-07-23 DIAGNOSIS — J301 Allergic rhinitis due to pollen: Secondary | ICD-10-CM | POA: Diagnosis not present

## 2024-07-23 DIAGNOSIS — J3081 Allergic rhinitis due to animal (cat) (dog) hair and dander: Secondary | ICD-10-CM | POA: Diagnosis not present

## 2024-07-23 DIAGNOSIS — J3089 Other allergic rhinitis: Secondary | ICD-10-CM | POA: Diagnosis not present

## 2024-07-30 DIAGNOSIS — J3089 Other allergic rhinitis: Secondary | ICD-10-CM | POA: Diagnosis not present

## 2024-07-30 DIAGNOSIS — J3081 Allergic rhinitis due to animal (cat) (dog) hair and dander: Secondary | ICD-10-CM | POA: Diagnosis not present

## 2024-07-30 DIAGNOSIS — J301 Allergic rhinitis due to pollen: Secondary | ICD-10-CM | POA: Diagnosis not present

## 2024-09-11 ENCOUNTER — Other Ambulatory Visit: Payer: Self-pay | Admitting: Behavioral Health

## 2024-09-11 DIAGNOSIS — F4542 Pain disorder with related psychological factors: Secondary | ICD-10-CM

## 2024-09-11 DIAGNOSIS — F330 Major depressive disorder, recurrent, mild: Secondary | ICD-10-CM

## 2024-09-11 DIAGNOSIS — F411 Generalized anxiety disorder: Secondary | ICD-10-CM

## 2024-09-15 ENCOUNTER — Ambulatory Visit: Admitting: Behavioral Health

## 2024-10-04 ENCOUNTER — Ambulatory Visit: Admitting: Behavioral Health
